# Patient Record
Sex: Female | Born: 1960 | Race: White | Hispanic: No | Marital: Married | Smoking: Never smoker
Health system: Southern US, Community
[De-identification: ages and names within clinical notes are randomized; demographics above are authoritative.]

## PROBLEM LIST (undated history)

## (undated) DIAGNOSIS — E049 Nontoxic goiter, unspecified: Secondary | ICD-10-CM

## (undated) DIAGNOSIS — U071 COVID-19: Secondary | ICD-10-CM

## (undated) DIAGNOSIS — C801 Malignant (primary) neoplasm, unspecified: Secondary | ICD-10-CM

## (undated) DIAGNOSIS — E041 Nontoxic single thyroid nodule: Secondary | ICD-10-CM

## (undated) DIAGNOSIS — M797 Fibromyalgia: Secondary | ICD-10-CM

## (undated) DIAGNOSIS — F32A Depression, unspecified: Secondary | ICD-10-CM

## (undated) DIAGNOSIS — M1990 Unspecified osteoarthritis, unspecified site: Secondary | ICD-10-CM

## (undated) DIAGNOSIS — E669 Obesity, unspecified: Secondary | ICD-10-CM

## (undated) DIAGNOSIS — Z87442 Personal history of urinary calculi: Secondary | ICD-10-CM

## (undated) DIAGNOSIS — E039 Hypothyroidism, unspecified: Secondary | ICD-10-CM

## (undated) DIAGNOSIS — K219 Gastro-esophageal reflux disease without esophagitis: Secondary | ICD-10-CM

## (undated) DIAGNOSIS — D126 Benign neoplasm of colon, unspecified: Secondary | ICD-10-CM

## (undated) DIAGNOSIS — K209 Esophagitis, unspecified without bleeding: Secondary | ICD-10-CM

## (undated) DIAGNOSIS — I509 Heart failure, unspecified: Secondary | ICD-10-CM

## (undated) DIAGNOSIS — I429 Cardiomyopathy, unspecified: Secondary | ICD-10-CM

## (undated) DIAGNOSIS — I209 Angina pectoris, unspecified: Secondary | ICD-10-CM

## (undated) DIAGNOSIS — R55 Syncope and collapse: Secondary | ICD-10-CM

## (undated) HISTORY — PX: CARDIAC CATHETERIZATION: SHX172

## (undated) HISTORY — PX: ABDOMINAL HYSTERECTOMY: SHX81

---

## 2007-04-26 ENCOUNTER — Emergency Department: Payer: Self-pay | Admitting: Emergency Medicine

## 2007-06-05 ENCOUNTER — Ambulatory Visit: Payer: Self-pay | Admitting: Internal Medicine

## 2007-07-01 ENCOUNTER — Ambulatory Visit: Payer: Self-pay | Admitting: Internal Medicine

## 2007-09-09 ENCOUNTER — Emergency Department: Payer: Self-pay | Admitting: Emergency Medicine

## 2008-05-06 ENCOUNTER — Ambulatory Visit: Payer: Self-pay | Admitting: Obstetrics and Gynecology

## 2008-05-11 ENCOUNTER — Ambulatory Visit: Payer: Self-pay | Admitting: Unknown Physician Specialty

## 2008-05-17 ENCOUNTER — Ambulatory Visit: Payer: Self-pay | Admitting: Obstetrics and Gynecology

## 2008-06-09 ENCOUNTER — Ambulatory Visit: Payer: Self-pay | Admitting: Internal Medicine

## 2008-08-12 ENCOUNTER — Ambulatory Visit: Payer: Self-pay | Admitting: Internal Medicine

## 2008-09-13 ENCOUNTER — Emergency Department: Payer: Self-pay | Admitting: Emergency Medicine

## 2009-03-05 ENCOUNTER — Emergency Department: Payer: Self-pay | Admitting: Emergency Medicine

## 2009-08-22 ENCOUNTER — Ambulatory Visit: Payer: Self-pay | Admitting: Internal Medicine

## 2009-11-05 ENCOUNTER — Emergency Department: Payer: Self-pay | Admitting: Unknown Physician Specialty

## 2010-02-16 ENCOUNTER — Emergency Department: Payer: Self-pay | Admitting: Internal Medicine

## 2010-03-23 ENCOUNTER — Ambulatory Visit: Payer: Self-pay | Admitting: Family Medicine

## 2010-06-16 ENCOUNTER — Emergency Department: Payer: Self-pay | Admitting: Emergency Medicine

## 2010-08-25 ENCOUNTER — Ambulatory Visit: Payer: Self-pay | Admitting: Family Medicine

## 2011-09-06 ENCOUNTER — Ambulatory Visit: Payer: Self-pay | Admitting: Family Medicine

## 2012-09-11 ENCOUNTER — Ambulatory Visit: Payer: Self-pay | Admitting: Cardiology

## 2012-09-18 ENCOUNTER — Ambulatory Visit: Payer: Self-pay | Admitting: Family Medicine

## 2013-09-24 ENCOUNTER — Ambulatory Visit: Payer: Self-pay | Admitting: Family Medicine

## 2014-10-01 ENCOUNTER — Ambulatory Visit
Admit: 2014-10-01 | Disposition: A | Discharge status: Home / Self Care | Payer: Self-pay | Attending: Family Medicine | Admitting: Family Medicine

## 2015-02-17 ENCOUNTER — Other Ambulatory Visit: Payer: Self-pay

## 2015-08-11 ENCOUNTER — Encounter: Payer: Self-pay | Admitting: *Deleted

## 2015-08-12 ENCOUNTER — Ambulatory Visit: Payer: BLUE CROSS/BLUE SHIELD | Admitting: Anesthesiology

## 2015-08-12 ENCOUNTER — Ambulatory Visit
Admission: RE | Admit: 2015-08-12 | Discharge: 2015-08-12 | Disposition: A | Discharge status: Home / Self Care | Payer: BLUE CROSS/BLUE SHIELD | Source: Ambulatory Visit | Attending: Unknown Physician Specialty | Admitting: Unknown Physician Specialty

## 2015-08-12 ENCOUNTER — Encounter
Admission: RE | Disposition: A | Discharge status: Home / Self Care | Payer: Self-pay | Source: Ambulatory Visit | Attending: Unknown Physician Specialty

## 2015-08-12 ENCOUNTER — Encounter: Payer: Self-pay | Admitting: Anesthesiology

## 2015-08-12 DIAGNOSIS — K640 First degree hemorrhoids: Secondary | ICD-10-CM | POA: Diagnosis not present

## 2015-08-12 DIAGNOSIS — Z1211 Encounter for screening for malignant neoplasm of colon: Secondary | ICD-10-CM | POA: Insufficient documentation

## 2015-08-12 DIAGNOSIS — E039 Hypothyroidism, unspecified: Secondary | ICD-10-CM | POA: Insufficient documentation

## 2015-08-12 DIAGNOSIS — D123 Benign neoplasm of transverse colon: Secondary | ICD-10-CM | POA: Diagnosis not present

## 2015-08-12 DIAGNOSIS — Z79899 Other long term (current) drug therapy: Secondary | ICD-10-CM | POA: Insufficient documentation

## 2015-08-12 DIAGNOSIS — D120 Benign neoplasm of cecum: Secondary | ICD-10-CM | POA: Insufficient documentation

## 2015-08-12 DIAGNOSIS — K210 Gastro-esophageal reflux disease with esophagitis: Secondary | ICD-10-CM | POA: Diagnosis not present

## 2015-08-12 HISTORY — DX: Cardiomyopathy, unspecified: I42.9

## 2015-08-12 HISTORY — DX: Heart failure, unspecified: I50.9

## 2015-08-12 HISTORY — DX: Hypothyroidism, unspecified: E03.9

## 2015-08-12 HISTORY — PX: COLONOSCOPY WITH PROPOFOL: SHX5780

## 2015-08-12 HISTORY — PX: ESOPHAGOGASTRODUODENOSCOPY (EGD) WITH PROPOFOL: SHX5813

## 2015-08-12 SURGERY — COLONOSCOPY WITH PROPOFOL
Anesthesia: General

## 2015-08-12 MED ORDER — SODIUM CHLORIDE 0.9 % IV SOLN
INTRAVENOUS | Status: DC
Start: 2015-08-12 — End: 2015-08-12

## 2015-08-12 MED ORDER — GLYCOPYRROLATE 0.2 MG/ML IJ SOLN
INTRAMUSCULAR | Status: DC | PRN
  Administered 2015-08-12: 0.2 mg via INTRAVENOUS

## 2015-08-12 MED ORDER — LIDOCAINE HCL (CARDIAC) 20 MG/ML IV SOLN
INTRAVENOUS | Status: DC | PRN
  Administered 2015-08-12: 50 mg via INTRAVENOUS

## 2015-08-12 MED ORDER — PROPOFOL 500 MG/50ML IV EMUL
INTRAVENOUS | Status: DC | PRN
  Administered 2015-08-12: 120 ug/kg/min via INTRAVENOUS

## 2015-08-12 MED ORDER — PROPOFOL 10 MG/ML IV BOLUS
INTRAVENOUS | Status: DC | PRN
  Administered 2015-08-12: 30 mg via INTRAVENOUS
  Administered 2015-08-12: 70 mg via INTRAVENOUS

## 2015-08-12 MED ORDER — MIDAZOLAM HCL 5 MG/5ML IJ SOLN
INTRAMUSCULAR | Status: DC | PRN
  Administered 2015-08-12: 1 mg via INTRAVENOUS

## 2015-08-12 MED ORDER — SODIUM CHLORIDE 0.9 % IV SOLN
INTRAVENOUS | Status: DC
  Administered 2015-08-12: 11:00:00 via INTRAVENOUS

## 2015-08-12 MED ORDER — FENTANYL CITRATE (PF) 100 MCG/2ML IJ SOLN
INTRAMUSCULAR | Status: DC | PRN
  Administered 2015-08-12: 50 ug via INTRAVENOUS

## 2015-08-12 NOTE — Anesthesia Postprocedure Evaluation (Signed)
Anesthesia Post Note  Patient: Carla Mann  Procedure(s) Performed: Procedure(s) (LRB): COLONOSCOPY WITH PROPOFOL (N/A) ESOPHAGOGASTRODUODENOSCOPY (EGD) WITH PROPOFOL (N/A)  Patient location during evaluation: Endoscopy Anesthesia Type: General Level of consciousness: awake and alert Pain management: pain level controlled Vital Signs Assessment: post-procedure vital signs reviewed and stable Respiratory status: spontaneous breathing, nonlabored ventilation, respiratory function stable and patient connected to nasal cannula oxygen Cardiovascular status: blood pressure returned to baseline and stable Postop Assessment: no signs of nausea or vomiting Anesthetic complications: no    Last Vitals:  Filed Vitals:   08/12/15 1218 08/12/15 1228  BP: 147/82 142/95  Pulse: 81 77  Temp:    Resp: 11 11    Last Pain: There were no vitals filed for this visit.               Precious Haws [place]

## 2015-08-12 NOTE — H&P (Signed)
   Primary Care Physician:  Pcp Not In System Primary Gastroenterologist:  Dr. Vira Agar EGD Pre-Procedure History & Physical: HPI:  Carla Mann is a 55 y.o. female is here for an endoscopy and colonoscopy.   Past Medical History  Diagnosis Date  . Cardiomyopathy ([place])   . CHF (congestive heart failure) ([place])   . Hypothyroidism     Past Surgical History  Procedure Laterality Date  . Cardiac catheterization      Prior to Admission medications   Medication Sig Start Date End Date Taking? Authorizing Provider  amitriptyline (ELAVIL) 50 MG tablet Take 50 mg by mouth at bedtime.   Yes Historical Provider, MD  carvedilol (COREG) 6.25 MG tablet Take 6.25 mg by mouth 2 (two) times daily with a meal.   Yes Historical Provider, MD  DULoxetine (CYMBALTA) 60 MG capsule Take 60 mg by mouth daily.   Yes Historical Provider, MD  gabapentin (NEURONTIN) 600 MG tablet Take 600 mg by mouth 3 (three) times daily.   Yes Historical Provider, MD  hydrochlorothiazide (HYDRODIURIL) 25 MG tablet Take 25 mg by mouth daily.   Yes Historical Provider, MD  levothyroxine (SYNTHROID, LEVOTHROID) 112 MCG tablet Take 112 mcg by mouth daily before breakfast.   Yes Historical Provider, MD  lisinopril (PRINIVIL,ZESTRIL) 5 MG tablet Take 5 mg by mouth daily.   Yes Historical Provider, MD  omeprazole (PRILOSEC) 20 MG capsule Take 20 mg by mouth daily.   Yes Historical Provider, MD  tizanidine (ZANAFLEX) 2 MG capsule Take 2 mg by mouth 3 (three) times daily.   Yes Historical Provider, MD    Allergies as of 07/26/2015  . (Not on File)    History reviewed. No pertinent family history.  Social History   Social History  . Marital Status: Married    Spouse Name: N/A  . Number of Children: N/A  . Years of Education: N/A   Occupational History  . Not on file.   Social History Main Topics  . Smoking status: Never Smoker   . Smokeless tobacco: Never Used  . Alcohol Use: No  . Drug Use: No  . Sexual Activity: Not  on file   Other Topics Concern  . Not on file   Social History Narrative    Review of Systems: See HPI, otherwise negative ROS  Physical Exam: BP 116/80 mmHg  Pulse 70  Temp(Src) 98.5 F (36.9 C) (Tympanic)  Resp 17  Ht 5\' 3"  (1.6 m)  Wt 114.306 kg (252 lb)  BMI 44.65 kg/m2  SpO2 99% General:   Alert,  pleasant and cooperative in NAD Head:  Normocephalic and atraumatic. Neck:  Supple; no masses or thyromegaly. Lungs:  Clear throughout to auscultation.    Heart:  Regular rate and rhythm. Abdomen:  Soft, nontender and nondistended. Normal bowel sounds, without guarding, and without rebound.   Neurologic:  Alert and  oriented x4;  grossly normal neurologically.  Impression/Plan: Carla Mann is here for an endoscopy and colonoscopy to be performed for [facility] colon polyps and chronic heartburn  Risks, benefits, limitations, and alternatives regarding  endoscopy and colonoscopy have been reviewed with the patient.  Questions have been answered.  All parties agreeable.   Gaylyn Cheers, MD  08/12/2015, 10:45 AM

## 2015-08-12 NOTE — Op Note (Signed)
[facility] Gastroenterology Patient Name: Carla Mann Procedure Date: 08/12/2015 10:18 AM MRN: [id number] Account #: [id number] Date of Birth: [birth year] Admit Type: Outpatient Age: 55 Room: [facility] ENDO ROOM 1 Gender: Female Note Status: Finalized Procedure:            Upper GI endoscopy Indications:          Heartburn Providers:            Manya Silvas, MD Referring MD:         Laurice Record. Manor, MD (Referring MD) Medicines:            Propofol per Anesthesia Complications:        No immediate complications. Procedure:            Pre-Anesthesia Assessment:                       - After reviewing the risks and benefits, the patient                        was deemed in satisfactory condition to undergo the                        procedure.                       After obtaining informed consent, the endoscope was                        passed under direct vision. Throughout the procedure,                        the patient's blood pressure, pulse, and oxygen                        saturations were monitored continuously. The Olympus                        GIF-160 endoscope (S#. [id number]) was introduced through                        the mouth, and advanced to the second part of duodenum.                        The upper GI endoscopy was accomplished without                        difficulty. The patient tolerated the procedure well. Findings:      LA Grade A (one or more mucosal breaks less than 5 mm, not extending       between tops of 2 mucosal folds) esophagitis with no bleeding was found       38 cm from the incisors. Biopsies were taken with a cold forceps for       histology.      The stomach was normal.      The examined duodenum was normal. Impression:           - LA Grade A reflux esophagitis. Rule out Barrett's                        esophagus. Biopsied.                       -  Normal stomach.                       - Normal examined  duodenum. Recommendation:       - Await pathology results.                       - Perform a colonoscopy as previously scheduled.                        Prilosec or similar medicine. Manya Silvas, MD 08/12/2015 10:57:47 AM This report has been signed electronically. Number of Addenda: 0 Note Initiated On: 08/12/2015 10:18 AM      [facility]

## 2015-08-12 NOTE — Anesthesia Preprocedure Evaluation (Signed)
Anesthesia Evaluation  Patient identified by MRN, date of birth, ID band Patient awake    Reviewed: Allergy & Precautions, H&P , NPO status , Patient's Chart, lab work & pertinent test results  Airway Mallampati: III  TM Distance: >3 FB Neck ROM: limited    Dental  (+) Poor Dentition, Chipped   Pulmonary neg pulmonary ROS, neg shortness of breath,    Pulmonary exam normal breath sounds clear to auscultation       Cardiovascular Exercise Tolerance: Good (-) angina+CHF  (-) Past MI and (-) DOE Normal cardiovascular exam Rhythm:regular Rate:Normal     Neuro/Psych negative neurological ROS  negative psych ROS   GI/Hepatic negative GI ROS, Neg liver ROS, neg GERD  ,  Endo/Other  Hypothyroidism   Renal/GU negative Renal ROS  negative genitourinary   Musculoskeletal   Abdominal   Peds  Hematology negative hematology ROS (+)   Anesthesia Other Findings Past Medical History:   Cardiomyopathy ([place])                                         CHF (congestive heart failure) ([facility])                         Hypothyroidism                                              Past Surgical History:   CARDIAC CATHETERIZATION                                      BMI    Body Mass Index   44.65 kg/m 2      Reproductive/Obstetrics negative OB ROS                             Anesthesia Physical Anesthesia Plan  ASA: III  Anesthesia Plan: General   Post-op Pain Management:    Induction:   Airway Management Planned:   Additional Equipment:   Intra-op Plan:   Post-operative Plan:   Informed Consent: I have reviewed the patients History and Physical, chart, labs and discussed the procedure including the risks, benefits and alternatives for the proposed anesthesia with the patient or authorized representative who has indicated his/her understanding and acceptance.   Dental Advisory Given  Plan Discussed  with: Anesthesiologist, CRNA and Surgeon  Anesthesia Plan Comments:         Anesthesia Quick Evaluation

## 2015-08-12 NOTE — Transfer of Care (Signed)
Immediate Anesthesia Transfer of Care Note  Patient: Carla Mann  Procedure(s) Performed: Procedure(s): COLONOSCOPY WITH PROPOFOL (N/A) ESOPHAGOGASTRODUODENOSCOPY (EGD) WITH PROPOFOL (N/A)  Patient Location: Endoscopy Unit  Anesthesia Type:General  Level of Consciousness: awake  Airway & Oxygen Therapy: Patient Spontanous Breathing and Patient connected to nasal cannula oxygen  Post-op Assessment: Report given to RN  Post vital signs: Reviewed  Last Vitals:  Filed Vitals:   08/12/15 1024 08/12/15 1157  BP: 116/80 149/95  Pulse: 70 87  Temp: 36.9 C 36.3 C  Resp: 17 14    Complications: No apparent anesthesia complications

## 2015-08-12 NOTE — Op Note (Signed)
[facility] Gastroenterology Patient Name: Carla Mann Procedure Date: 08/12/2015 10:18 AM MRN: [id number] Account #: [id number] Date of Birth: [birth year] Admit Type: Outpatient Age: 55 Room: [facility] ENDO ROOM 1 Gender: Female Note Status: Finalized Procedure:            Colonoscopy Indications:          High risk colon cancer surveillance: Personal history                        of colonic polyps Providers:            Manya Silvas, MD Medicines:            Propofol per Anesthesia Complications:        No immediate complications. Procedure:            Pre-Anesthesia Assessment:                       - After reviewing the risks and benefits, the patient                        was deemed in satisfactory condition to undergo the                        procedure.                       After obtaining informed consent, the colonoscope was                        passed under direct vision. Throughout the procedure,                        the patient's blood pressure, pulse, and oxygen                        saturations were monitored continuously. The                        Colonoscope was introduced through the anus and                        advanced to the the cecum, identified by appendiceal                        orifice and ileocecal valve. The colonoscopy was                        performed without difficulty. The patient tolerated the                        procedure well. The quality of the bowel preparation                        was good. Findings:      Two sessile polyps were found in the hepatic flexure. The polyps were       diminutive in size. These polyps were removed with a jumbo cold forceps.       Resection and retrieval were complete.      Two sessile polyps were found in the hepatic flexure and cecum. The  polyps were small in size. These polyps were removed with a cold snare.       Resection and retrieval were complete.      Internal  hemorrhoids were found during endoscopy. The hemorrhoids were       small and Grade I (internal hemorrhoids that do not prolapse).      The exam was otherwise without abnormality. Impression:           - Two diminutive polyps at the hepatic flexure, removed                        with a jumbo cold forceps. Resected and retrieved.                       - Two small polyps at the hepatic flexure and in the                        cecum, removed with a cold snare. Resected and                        retrieved.                       - Internal hemorrhoids.                       - The examination was otherwise normal. Recommendation:       - Await pathology results. Manya Silvas, MD 08/12/2015 11:58:14 AM This report has been signed electronically. Number of Addenda: 0 Note Initiated On: 08/12/2015 10:18 AM Scope Withdrawal Time: 0 hours 16 minutes 43 seconds  Total Procedure Duration: 0 hours 28 minutes 28 seconds       [facility]

## 2015-08-15 LAB — SURGICAL PATHOLOGY

## 2015-08-17 ENCOUNTER — Encounter: Payer: Self-pay | Admitting: Unknown Physician Specialty

## 2015-08-18 ENCOUNTER — Emergency Department: Payer: BLUE CROSS/BLUE SHIELD

## 2015-08-18 ENCOUNTER — Emergency Department
Admission: EM | Admit: 2015-08-18 | Discharge: 2015-08-18 | Disposition: A | Discharge status: Home / Self Care | Payer: BLUE CROSS/BLUE SHIELD | Attending: Emergency Medicine | Admitting: Emergency Medicine

## 2015-08-18 ENCOUNTER — Encounter: Payer: Self-pay | Admitting: Emergency Medicine

## 2015-08-18 DIAGNOSIS — Y998 Other external cause status: Secondary | ICD-10-CM | POA: Diagnosis not present

## 2015-08-18 DIAGNOSIS — Y9289 Other specified places as the place of occurrence of the external cause: Secondary | ICD-10-CM | POA: Diagnosis not present

## 2015-08-18 DIAGNOSIS — S199XXA Unspecified injury of neck, initial encounter: Secondary | ICD-10-CM | POA: Diagnosis present

## 2015-08-18 DIAGNOSIS — S299XXA Unspecified injury of thorax, initial encounter: Secondary | ICD-10-CM | POA: Insufficient documentation

## 2015-08-18 DIAGNOSIS — S4991XA Unspecified injury of right shoulder and upper arm, initial encounter: Secondary | ICD-10-CM | POA: Insufficient documentation

## 2015-08-18 DIAGNOSIS — W19XXXA Unspecified fall, initial encounter: Secondary | ICD-10-CM

## 2015-08-18 DIAGNOSIS — S0990XA Unspecified injury of head, initial encounter: Secondary | ICD-10-CM | POA: Diagnosis not present

## 2015-08-18 DIAGNOSIS — Z880 Allergy status to penicillin: Secondary | ICD-10-CM | POA: Insufficient documentation

## 2015-08-18 DIAGNOSIS — S8991XA Unspecified injury of right lower leg, initial encounter: Secondary | ICD-10-CM | POA: Insufficient documentation

## 2015-08-18 DIAGNOSIS — S3992XA Unspecified injury of lower back, initial encounter: Secondary | ICD-10-CM | POA: Diagnosis not present

## 2015-08-18 DIAGNOSIS — Y9301 Activity, walking, marching and hiking: Secondary | ICD-10-CM | POA: Insufficient documentation

## 2015-08-18 DIAGNOSIS — S6991XA Unspecified injury of right wrist, hand and finger(s), initial encounter: Secondary | ICD-10-CM | POA: Diagnosis not present

## 2015-08-18 DIAGNOSIS — T148 Other injury of unspecified body region: Secondary | ICD-10-CM | POA: Insufficient documentation

## 2015-08-18 DIAGNOSIS — W108XXA Fall (on) (from) other stairs and steps, initial encounter: Secondary | ICD-10-CM | POA: Diagnosis not present

## 2015-08-18 DIAGNOSIS — T07XXXA Unspecified multiple injuries, initial encounter: Secondary | ICD-10-CM

## 2015-08-18 DIAGNOSIS — Z79899 Other long term (current) drug therapy: Secondary | ICD-10-CM | POA: Insufficient documentation

## 2015-08-18 NOTE — ED Provider Notes (Signed)
[facility] Emergency Department Provider Note  Time seen: 3:44 PM  I have reviewed the triage vital signs and the nursing notes.   HISTORY  Chief Complaint Fall    HPI Carla Mann is a 55 y.o. female with a past medical history CHF, presents to the emergency department after a fall. According to the patient she was walking backwards down the steps when she fell approximately 3 steps backwards hitting her head. Denies LOC, nausea or vomiting. Patient states pain to her head and neck back and right arm, did not attempt to ambulate. Patient laid on the floor until her husband came home approximately 30 minutes later and called EMS who brought her to the emergency department here the patient states she feels contusions, bodyaches, does not wish for anything for pain at this time. States mild headache, moderate neck pain, moderate right shoulder pain, mild right knee pain and mild diffuse back pain.Denies any focal weakness or numbness.     Past Medical History  Diagnosis Date  . Cardiomyopathy ([place])   . CHF (congestive heart failure) ([place])   . Hypothyroidism     There are no active problems to display for this patient.   Past Surgical History  Procedure Laterality Date  . Cardiac catheterization    . Colonoscopy with propofol N/A 08/12/2015    Procedure: COLONOSCOPY WITH PROPOFOL;  Surgeon: Manya Silvas, MD;  Location: [facility] ENDOSCOPY;  Service: Endoscopy;  Laterality: N/A;  . Esophagogastroduodenoscopy (egd) with propofol N/A 08/12/2015    Procedure: ESOPHAGOGASTRODUODENOSCOPY (EGD) WITH PROPOFOL;  Surgeon: Manya Silvas, MD;  Location: [facility] ENDOSCOPY;  Service: Endoscopy;  Laterality: N/A;    Current Outpatient Rx  Name  Route  Sig  Dispense  Refill  . amitriptyline (ELAVIL) 50 MG tablet   Oral   Take 50 mg by mouth at bedtime.         . carvedilol (COREG) 6.25 MG tablet   Oral   Take 6.25 mg by mouth 2 (two) times daily with a meal.          . DULoxetine (CYMBALTA) 60 MG capsule   Oral   Take 60 mg by mouth daily.         Marland Kitchen gabapentin (NEURONTIN) 600 MG tablet   Oral   Take 600 mg by mouth 3 (three) times daily.         . hydrochlorothiazide (HYDRODIURIL) 25 MG tablet   Oral   Take 25 mg by mouth daily.         Marland Kitchen levothyroxine (SYNTHROID, LEVOTHROID) 112 MCG tablet   Oral   Take 112 mcg by mouth daily before breakfast.         . lisinopril (PRINIVIL,ZESTRIL) 5 MG tablet   Oral   Take 5 mg by mouth daily.         Marland Kitchen omeprazole (PRILOSEC) 20 MG capsule   Oral   Take 20 mg by mouth daily.         . tizanidine (ZANAFLEX) 2 MG capsule   Oral   Take 2 mg by mouth 3 (three) times daily.           Allergies Penicillins  History reviewed. No pertinent family history.  Social History Social History  Substance Use Topics  . Smoking status: Never Smoker   . Smokeless tobacco: Never Used  . Alcohol Use: No    Review of Systems Constitutional: Negative for fever. Cardiovascular: Negative for chest pain. Respiratory: Negative for shortness of  breath. Gastrointestinal: Negative for abdominal pain Musculoskeletal: Mild diffuse back pain. Positive right knee pain. Positive right shoulder pain. Mild right hand pain. Positive neck pain. Positive occipital scalp pain. Skin: Negative for rash. No abrasions or lacerations. Neurological: Mild headache denies focal weakness or numbness 10-point ROS otherwise negative.  ____________________________________________   PHYSICAL EXAM:  VITAL SIGNS: ED Triage Vitals  Enc Vitals Group     BP 08/18/15 0941 124/85 mmHg     Pulse Rate 08/18/15 0941 68     Resp 08/18/15 0941 18     Temp 08/18/15 0941 97.9 F (36.6 C)     Temp Source 08/18/15 0941 Oral     SpO2 08/18/15 0941 97 %     Weight 08/18/15 0941 250 lb (113.399 kg)     Height 08/18/15 0941 5\' 3"  (1.6 m)     Head Cir --      Peak Flow --      Pain Score 08/18/15 0941 7     Pain Loc --       Pain Edu? --      Excl. in [place]? --     Constitutional: Alert and oriented. Well appearing and in no distress. Eyes: Normal exam ENT   Head: Normocephalic and atraumatic. Mild midline C-spine tenderness palpation, c-collar in place.   Mouth/Throat: Mucous membranes are moist. Cardiovascular: Normal rate, regular rhythm.  Respiratory: Normal respiratory effort without tachypnea nor retractions. Breath sounds are clear. Mild diffuse chest tenderness to palpation, no focal tenderness identified. Gastrointestinal: Soft and nontender. No distention.  Musculoskeletal: Moderate right shoulder pain, mild right hand pain, mild right knee pain. Mild cervical spine tenderness palpation, no thoracic tenderness, mild lumbar tenderness to palpation. Pelvis is stable. Good range of motion in hips without pain. Neurologic:  Normal speech and language. No gross focal neurologic deficits Skin:  Skin is warm, dry and intact.  Psychiatric: Mood and affect are normal. Speech and behavior are normal. ____________________________________________   RADIOLOGY  CT showed no acute abnormality X-rays are negative for acute fracture.  ____________________________________________    INITIAL IMPRESSION / ASSESSMENT AND PLAN / ED COURSE  Pertinent labs & imaging results that were available during my care of the patient were reviewed by me and considered in my medical decision making (see chart for details).  Patient presents after mechanical fall backwards. Denies LOC, nausea, vomiting, focal weakness or numbness. CTs are negative. X-ray showed no acute fracture. Patient does not wish for pain medication states she will take Advil at home. Patient able to ambulate without difficulty after workup was completed. We will discharge patient home with primary care follow-up.  ____________________________________________   FINAL CLINICAL IMPRESSION(S) / ED DIAGNOSES  Fall Multiple contusions  Harvest Dark, MD 08/18/15 1547

## 2015-08-18 NOTE — ED Notes (Signed)
Pt up to br with assistance.  Tolerated walking well

## 2015-08-18 NOTE — ED Notes (Signed)
Pt's head elevated in bed

## 2015-08-18 NOTE — Discharge Instructions (Signed)

## 2015-08-18 NOTE — ED Notes (Signed)
Resumed care from Ramonica Grigg t. Rn.  c-collar still in place.  Pt alert.  famiy with pt.

## 2015-08-18 NOTE — ED Notes (Signed)
Per [facility] pt was walking downstairss backwards when she fell. Pt states she hit her head, denies LOC. EMS states unable to put on a c-collar because it would not fit. C-Collar applied by this RN on arrival. EMS states pain to R shoulder, hip, knee and neck at this time. EMS states no rotation or shortening noted to hip or knee. Pty BP 142/71, NSR 77. EMS states that she has not had her morning meds at this time.

## 2015-08-18 NOTE — ED Notes (Signed)
Pt still in x-ray

## 2015-08-18 NOTE — ED Notes (Addendum)
c collar removed by md

## 2017-05-03 ENCOUNTER — Other Ambulatory Visit: Payer: Self-pay | Admitting: Family Medicine

## 2017-05-03 DIAGNOSIS — Z1231 Encounter for screening mammogram for malignant neoplasm of breast: Secondary | ICD-10-CM

## 2017-05-24 ENCOUNTER — Ambulatory Visit
Admission: RE | Admit: 2017-05-24 | Discharge: 2017-05-24 | Disposition: A | Discharge status: Home / Self Care | Payer: BLUE CROSS/BLUE SHIELD | Source: Ambulatory Visit | Attending: Family Medicine | Admitting: Family Medicine

## 2017-05-24 DIAGNOSIS — Z1231 Encounter for screening mammogram for malignant neoplasm of breast: Secondary | ICD-10-CM | POA: Diagnosis not present

## 2017-05-24 HISTORY — DX: Malignant (primary) neoplasm, unspecified: C80.1

## 2018-04-21 ENCOUNTER — Other Ambulatory Visit: Payer: Self-pay | Admitting: Family Medicine

## 2018-04-21 DIAGNOSIS — Z1231 Encounter for screening mammogram for malignant neoplasm of breast: Secondary | ICD-10-CM

## 2018-05-26 ENCOUNTER — Encounter (INDEPENDENT_AMBULATORY_CARE_PROVIDER_SITE_OTHER): Payer: Self-pay

## 2018-05-26 ENCOUNTER — Ambulatory Visit
Admission: RE | Admit: 2018-05-26 | Discharge: 2018-05-26 | Disposition: A | Discharge status: Home / Self Care | Payer: BLUE CROSS/BLUE SHIELD | Source: Ambulatory Visit | Attending: Family Medicine | Admitting: Family Medicine

## 2018-05-26 DIAGNOSIS — Z1231 Encounter for screening mammogram for malignant neoplasm of breast: Secondary | ICD-10-CM | POA: Diagnosis not present

## 2018-10-06 ENCOUNTER — Ambulatory Visit
Admission: RE | Admit: 2018-10-06 | Payer: BLUE CROSS/BLUE SHIELD | Source: Home / Self Care | Admitting: Unknown Physician Specialty

## 2018-10-06 ENCOUNTER — Encounter: Admission: RE | Payer: Self-pay | Source: Home / Self Care

## 2018-10-06 SURGERY — COLONOSCOPY WITH PROPOFOL
Anesthesia: General

## 2019-04-23 ENCOUNTER — Other Ambulatory Visit
Admission: RE | Admit: 2019-04-23 | Discharge: 2019-04-23 | Disposition: A | Discharge status: Home / Self Care | Payer: Medicare Other | Source: Ambulatory Visit | Attending: Internal Medicine | Admitting: Internal Medicine

## 2019-04-23 ENCOUNTER — Other Ambulatory Visit: Payer: Self-pay

## 2019-04-27 ENCOUNTER — Other Ambulatory Visit: Payer: Self-pay | Admitting: Family Medicine

## 2019-04-27 DIAGNOSIS — Z1231 Encounter for screening mammogram for malignant neoplasm of breast: Secondary | ICD-10-CM

## 2019-06-01 ENCOUNTER — Ambulatory Visit
Admission: RE | Admit: 2019-06-01 | Discharge: 2019-06-01 | Disposition: A | Discharge status: Home / Self Care | Payer: BC Managed Care – PPO | Source: Ambulatory Visit | Attending: Family Medicine | Admitting: Family Medicine

## 2019-06-01 DIAGNOSIS — Z1231 Encounter for screening mammogram for malignant neoplasm of breast: Secondary | ICD-10-CM | POA: Diagnosis not present

## 2019-06-10 ENCOUNTER — Ambulatory Visit: Payer: Medicare Other | Attending: Internal Medicine

## 2019-06-10 DIAGNOSIS — Z20822 Contact with and (suspected) exposure to covid-19: Secondary | ICD-10-CM

## 2019-06-12 LAB — NOVEL CORONAVIRUS, NAA: SARS-CoV-2, NAA: NOT DETECTED

## 2019-06-22 ENCOUNTER — Ambulatory Visit: Payer: Medicare Other | Attending: Internal Medicine

## 2019-06-22 DIAGNOSIS — Z20822 Contact with and (suspected) exposure to covid-19: Secondary | ICD-10-CM

## 2019-06-23 ENCOUNTER — Telehealth: Payer: Self-pay | Admitting: *Deleted

## 2019-06-23 ENCOUNTER — Emergency Department: Payer: BC Managed Care – PPO

## 2019-06-23 ENCOUNTER — Encounter: Payer: Self-pay | Admitting: Emergency Medicine

## 2019-06-23 ENCOUNTER — Other Ambulatory Visit: Payer: Self-pay

## 2019-06-23 ENCOUNTER — Emergency Department
Admission: EM | Admit: 2019-06-23 | Discharge: 2019-06-23 | Disposition: A | Discharge status: Home / Self Care | Payer: BC Managed Care – PPO | Attending: Emergency Medicine | Admitting: Emergency Medicine

## 2019-06-23 DIAGNOSIS — I509 Heart failure, unspecified: Secondary | ICD-10-CM | POA: Insufficient documentation

## 2019-06-23 DIAGNOSIS — Z79899 Other long term (current) drug therapy: Secondary | ICD-10-CM | POA: Diagnosis not present

## 2019-06-23 DIAGNOSIS — Z8541 Personal history of malignant neoplasm of cervix uteri: Secondary | ICD-10-CM | POA: Insufficient documentation

## 2019-06-23 DIAGNOSIS — R0789 Other chest pain: Secondary | ICD-10-CM | POA: Diagnosis present

## 2019-06-23 DIAGNOSIS — E039 Hypothyroidism, unspecified: Secondary | ICD-10-CM | POA: Diagnosis not present

## 2019-06-23 DIAGNOSIS — J1289 Other viral pneumonia: Secondary | ICD-10-CM | POA: Diagnosis not present

## 2019-06-23 DIAGNOSIS — U071 COVID-19: Secondary | ICD-10-CM | POA: Insufficient documentation

## 2019-06-23 LAB — CBC
HCT: 34.9 % — ABNORMAL LOW (ref 36.0–46.0)
Hemoglobin: 11.5 g/dL — ABNORMAL LOW (ref 12.0–15.0)
MCH: 27.3 pg (ref 26.0–34.0)
MCHC: 33 g/dL (ref 30.0–36.0)
MCV: 82.7 fL (ref 80.0–100.0)
Platelets: 322 10*3/uL (ref 150–400)
RBC: 4.22 MIL/uL (ref 3.87–5.11)
RDW: 13.8 % (ref 11.5–15.5)
WBC: 8.8 10*3/uL (ref 4.0–10.5)
nRBC: 0 % (ref 0.0–0.2)

## 2019-06-23 LAB — BASIC METABOLIC PANEL
Anion gap: 14 (ref 5–15)
BUN: 16 mg/dL (ref 6–20)
CO2: 26 mmol/L (ref 22–32)
Calcium: 8.7 mg/dL — ABNORMAL LOW (ref 8.9–10.3)
Chloride: 95 mmol/L — ABNORMAL LOW (ref 98–111)
Creatinine, Ser: 1.19 mg/dL — ABNORMAL HIGH (ref 0.44–1.00)
GFR calc Af Amer: 58 mL/min — ABNORMAL LOW (ref >60)
GFR calc non Af Amer: 50 mL/min — ABNORMAL LOW (ref >60)
Glucose, Bld: 117 mg/dL — ABNORMAL HIGH (ref 70–99)
Potassium: 2.9 mmol/L — ABNORMAL LOW (ref 3.5–5.1)
Sodium: 135 mmol/L (ref 135–145)

## 2019-06-23 LAB — TROPONIN I (HIGH SENSITIVITY): Troponin I (High Sensitivity): 5 ng/L (ref <18)

## 2019-06-23 LAB — NOVEL CORONAVIRUS, NAA: SARS-CoV-2, NAA: DETECTED — AB

## 2019-06-23 NOTE — Telephone Encounter (Signed)
Reviewed positive Covid 19 results with patient. Moderate to severe SOB noted while talking with her. She has been using a rescue inhaler with no improvement. Advised ED at this time, with mask on.  TC to [facility] with report on patient's arrival by private vehicle.

## 2019-06-23 NOTE — ED Provider Notes (Signed)
[facility] Emergency Department Provider Note   ____________________________________________    I have reviewed the triage vital signs and the nursing notes.   HISTORY  Chief Complaint Chest Pain and Shortness of Breath     HPI Carla Mann is a 59 y.o. female with a history of CHF who presents with complaints of chest discomfort and shortness of breath.  Patient recently diagnosed with novel coronavirus.  Patient reports shortness of breath is gradually worsened over the last day and she was uncomfortable last night and had difficulty sleeping.  She describes achiness in her chest for multiple days.  Reports her husband has similar symptoms.  No nausea or vomiting or abdominal pain.  Has not take anything for this besides ibuprofen  Past Medical History:  Diagnosis Date  . Cancer ([place])    cervical  . Cardiomyopathy ([place])   . CHF (congestive heart failure) ([place])   . Hypothyroidism     There are no problems to display for this patient.   Past Surgical History:  Procedure Laterality Date  . ABDOMINAL HYSTERECTOMY    . CARDIAC CATHETERIZATION    . COLONOSCOPY WITH PROPOFOL N/A 08/12/2015   Procedure: COLONOSCOPY WITH PROPOFOL;  Surgeon: Manya Silvas, MD;  Location: [facility] ENDOSCOPY;  Service: Endoscopy;  Laterality: N/A;  . ESOPHAGOGASTRODUODENOSCOPY (EGD) WITH PROPOFOL N/A 08/12/2015   Procedure: ESOPHAGOGASTRODUODENOSCOPY (EGD) WITH PROPOFOL;  Surgeon: Manya Silvas, MD;  Location: [facility] ENDOSCOPY;  Service: Endoscopy;  Laterality: N/A;    Prior to Admission medications   Medication Sig Start Date End Date Taking? Authorizing Provider  amitriptyline (ELAVIL) 50 MG tablet Take 50 mg by mouth at bedtime.    [provider]  carvedilol (COREG) 6.25 MG tablet Take 6.25 mg by mouth 2 (two) times daily with a meal.    [provider]  DULoxetine (CYMBALTA) 60 MG capsule Take 60 mg by mouth daily.    [provider]    gabapentin (NEURONTIN) 600 MG tablet Take 600 mg by mouth 3 (three) times daily.    [provider]  hydrochlorothiazide (HYDRODIURIL) 25 MG tablet Take 25 mg by mouth daily.    [provider]  levothyroxine (SYNTHROID, LEVOTHROID) 112 MCG tablet Take 112 mcg by mouth daily before breakfast.    [provider]  lisinopril (PRINIVIL,ZESTRIL) 5 MG tablet Take 5 mg by mouth daily.    [provider]  omeprazole (PRILOSEC) 20 MG capsule Take 20 mg by mouth daily.    [provider]  tizanidine (ZANAFLEX) 2 MG capsule Take 2 mg by mouth 3 (three) times daily.    [provider]     Allergies Penicillins  Family History  Problem Relation Age of Onset  . Breast cancer Neg Hx     Social History Social History   Tobacco Use  . Smoking status: Never Smoker  . Smokeless tobacco: Never Used  Substance Use Topics  . Alcohol use: No  . Drug use: No    Review of Systems  Constitutional: Fevers Eyes: No visual changes.  ENT: No sore throat. Cardiovascular: As above Respiratory: As above Gastrointestinal: No abdominal pain.  No nausea, no vomiting.   Genitourinary: Negative for dysuria. Musculoskeletal: Body aches Skin: Negative for rash. Neurological: Negative for headaches or weakness   ____________________________________________   PHYSICAL EXAM:  VITAL SIGNS: ED Triage Vitals  Enc Vitals Group     BP 06/23/19 0906 (!) 109/52     Pulse Rate 06/23/19 0906 61  Resp 06/23/19 0906 (!) 24     Temp 06/23/19 0906 97.8 F (36.6 C)     Temp Source 06/23/19 0906 Oral     SpO2 06/23/19 0906 99 %     Weight 06/23/19 0844 113.4 kg (250 lb)     Height 06/23/19 0844 1.6 m (5\' 3" )     Head Circumference --      Peak Flow --      Pain Score 06/23/19 0844 9     Pain Loc --      Pain Edu? --      Excl. in [place]? --     Constitutional: Alert and oriented.    Nose: No congestion/rhinnorhea. Mouth/Throat: Mucous membranes are  moist.    Cardiovascular: Normal rate, regular rhythm.   Good peripheral circulation. Respiratory: Normal respiratory effort.  No retractions.  Genitourinary: deferred Musculoskeletal: No lower extremity tenderness nor edema.  Warm and well perfused Neurologic:  Normal speech and language. No gross focal neurologic deficits are appreciated.  Skin:  Skin is warm, dry and intact. No rash noted. Psychiatric: Mood and affect are normal. Speech and behavior are normal.  ____________________________________________   LABS (all labs ordered are listed, but only abnormal results are displayed)  Labs Reviewed  BASIC METABOLIC PANEL - Abnormal; Notable for the following components:      Result Value   Potassium 2.9 (*)    Chloride 95 (*)    Glucose, Bld 117 (*)    Creatinine, Ser 1.19 (*)    Calcium 8.7 (*)    GFR calc non Af Amer 50 (*)    GFR calc Af Amer 58 (*)    All other components within normal limits  CBC - Abnormal; Notable for the following components:   Hemoglobin 11.5 (*)    HCT 34.9 (*)    All other components within normal limits  TROPONIN I (HIGH SENSITIVITY)  TROPONIN I (HIGH SENSITIVITY)   ____________________________________________  EKG  ED ECG REPORT I, Lavonia Drafts, the attending physician, personally viewed and interpreted this ECG.  Date: 06/23/2019  Rhythm: normal sinus rhythm QRS Axis: normal Intervals: normal ST/T Wave abnormalities: normal Narrative Interpretation: no evidence of acute ischemia  ____________________________________________  RADIOLOGY  Chest x-ray consistent with multifocal pneumonia ____________________________________________   PROCEDURES  Procedure(s) performed: No  Procedures   Critical Care performed: No ____________________________________________   INITIAL IMPRESSION / ASSESSMENT AND PLAN / ED COURSE  Pertinent labs & imaging results that were available during my care of the patient were reviewed by me and  considered in my medical decision making (see chart for details).  Patient presents with known COVID-19 recently diagnosed now with mildly worsening shortness of breath with chest discomfort.  Chest x-ray is consistent with Covid pneumonia.  Lab work is overall reassuring, troponin is normal, EKG is unremarkable.  Not consistent with ACS.  Chest discomfort likely caused by Covid pneumonia.  No occasion for steroids at this point.  Recommend Tylenol Motrin, outpatient follow-up as needed, return precautions discussed at length    ____________________________________________   FINAL CLINICAL IMPRESSION(S) / ED DIAGNOSES  Final diagnoses:  Pneumonia due to COVID-19 virus        Note:  This document was prepared using Dragon voice recognition software and may include unintentional dictation errors.   Lavonia Drafts, MD 06/23/19 1024

## 2019-06-23 NOTE — ED Triage Notes (Signed)
Pt reports tested positive for COVID yesterday and today  with increased SOB and chest tightness. Pt called [facility] triage line and was told to come here.

## 2019-08-12 ENCOUNTER — Ambulatory Visit: Admission: RE | Admit: 2019-08-12 | Payer: Medicare Other | Source: Home / Self Care | Admitting: Internal Medicine

## 2019-08-12 ENCOUNTER — Encounter: Admission: RE | Payer: Self-pay | Source: Home / Self Care

## 2019-08-12 ENCOUNTER — Ambulatory Visit: Admit: 2019-08-12 | Payer: Medicare Other | Admitting: Internal Medicine

## 2019-08-12 SURGERY — COLONOSCOPY WITH PROPOFOL
Anesthesia: General

## 2019-10-30 ENCOUNTER — Other Ambulatory Visit: Payer: Self-pay | Admitting: Family Medicine

## 2019-10-30 DIAGNOSIS — N644 Mastodynia: Secondary | ICD-10-CM

## 2019-11-02 ENCOUNTER — Other Ambulatory Visit: Payer: Self-pay | Admitting: Family Medicine

## 2019-11-02 DIAGNOSIS — N644 Mastodynia: Secondary | ICD-10-CM

## 2019-11-18 ENCOUNTER — Ambulatory Visit
Admission: RE | Admit: 2019-11-18 | Discharge: 2019-11-18 | Disposition: A | Discharge status: Home / Self Care | Payer: BC Managed Care – PPO | Source: Ambulatory Visit | Attending: Family Medicine | Admitting: Family Medicine

## 2019-11-18 DIAGNOSIS — N644 Mastodynia: Secondary | ICD-10-CM

## 2019-12-01 ENCOUNTER — Other Ambulatory Visit
Admission: RE | Admit: 2019-12-01 | Discharge: 2019-12-01 | Disposition: A | Discharge status: Home / Self Care | Payer: BC Managed Care – PPO | Source: Ambulatory Visit | Attending: Internal Medicine | Admitting: Internal Medicine

## 2019-12-01 ENCOUNTER — Other Ambulatory Visit: Payer: Self-pay

## 2019-12-01 DIAGNOSIS — Z01812 Encounter for preprocedural laboratory examination: Secondary | ICD-10-CM | POA: Insufficient documentation

## 2019-12-01 DIAGNOSIS — Z20822 Contact with and (suspected) exposure to covid-19: Secondary | ICD-10-CM | POA: Diagnosis not present

## 2019-12-02 ENCOUNTER — Encounter: Payer: Self-pay | Admitting: Internal Medicine

## 2019-12-02 LAB — SARS CORONAVIRUS 2 (TAT 6-24 HRS): SARS Coronavirus 2: NEGATIVE

## 2019-12-03 ENCOUNTER — Encounter
Admission: RE | Disposition: A | Discharge status: Home / Self Care | Payer: Self-pay | Source: Home / Self Care | Attending: Internal Medicine

## 2019-12-03 ENCOUNTER — Encounter: Payer: Self-pay | Admitting: Internal Medicine

## 2019-12-03 ENCOUNTER — Ambulatory Visit
Admission: RE | Admit: 2019-12-03 | Discharge: 2019-12-03 | Disposition: A | Discharge status: Home / Self Care | Payer: BC Managed Care – PPO | Attending: Internal Medicine | Admitting: Internal Medicine

## 2019-12-03 ENCOUNTER — Ambulatory Visit: Payer: BC Managed Care – PPO | Admitting: Anesthesiology

## 2019-12-03 DIAGNOSIS — E039 Hypothyroidism, unspecified: Secondary | ICD-10-CM | POA: Insufficient documentation

## 2019-12-03 DIAGNOSIS — Z791 Long term (current) use of non-steroidal anti-inflammatories (NSAID): Secondary | ICD-10-CM | POA: Insufficient documentation

## 2019-12-03 DIAGNOSIS — F329 Major depressive disorder, single episode, unspecified: Secondary | ICD-10-CM | POA: Insufficient documentation

## 2019-12-03 DIAGNOSIS — Z6841 Body Mass Index (BMI) 40.0 and over, adult: Secondary | ICD-10-CM | POA: Diagnosis not present

## 2019-12-03 DIAGNOSIS — Z1211 Encounter for screening for malignant neoplasm of colon: Secondary | ICD-10-CM | POA: Insufficient documentation

## 2019-12-03 DIAGNOSIS — Z79899 Other long term (current) drug therapy: Secondary | ICD-10-CM | POA: Insufficient documentation

## 2019-12-03 DIAGNOSIS — Z79890 Hormone replacement therapy (postmenopausal): Secondary | ICD-10-CM | POA: Diagnosis not present

## 2019-12-03 DIAGNOSIS — Z8541 Personal history of malignant neoplasm of cervix uteri: Secondary | ICD-10-CM | POA: Diagnosis not present

## 2019-12-03 DIAGNOSIS — M797 Fibromyalgia: Secondary | ICD-10-CM | POA: Diagnosis not present

## 2019-12-03 DIAGNOSIS — E669 Obesity, unspecified: Secondary | ICD-10-CM | POA: Insufficient documentation

## 2019-12-03 DIAGNOSIS — I509 Heart failure, unspecified: Secondary | ICD-10-CM | POA: Diagnosis not present

## 2019-12-03 DIAGNOSIS — Z86010 Personal history of colonic polyps: Secondary | ICD-10-CM | POA: Diagnosis not present

## 2019-12-03 DIAGNOSIS — D120 Benign neoplasm of cecum: Secondary | ICD-10-CM | POA: Diagnosis not present

## 2019-12-03 DIAGNOSIS — D1779 Benign lipomatous neoplasm of other sites: Secondary | ICD-10-CM | POA: Insufficient documentation

## 2019-12-03 DIAGNOSIS — Z8616 Personal history of COVID-19: Secondary | ICD-10-CM | POA: Insufficient documentation

## 2019-12-03 DIAGNOSIS — I429 Cardiomyopathy, unspecified: Secondary | ICD-10-CM | POA: Insufficient documentation

## 2019-12-03 DIAGNOSIS — K219 Gastro-esophageal reflux disease without esophagitis: Secondary | ICD-10-CM | POA: Diagnosis not present

## 2019-12-03 DIAGNOSIS — M1990 Unspecified osteoarthritis, unspecified site: Secondary | ICD-10-CM | POA: Diagnosis not present

## 2019-12-03 DIAGNOSIS — K640 First degree hemorrhoids: Secondary | ICD-10-CM | POA: Insufficient documentation

## 2019-12-03 DIAGNOSIS — Z880 Allergy status to penicillin: Secondary | ICD-10-CM | POA: Insufficient documentation

## 2019-12-03 HISTORY — DX: Nontoxic single thyroid nodule: E04.1

## 2019-12-03 HISTORY — DX: Nontoxic goiter, unspecified: E04.9

## 2019-12-03 HISTORY — DX: Gastro-esophageal reflux disease without esophagitis: K21.9

## 2019-12-03 HISTORY — DX: Obesity, unspecified: E66.9

## 2019-12-03 HISTORY — DX: COVID-19: U07.1

## 2019-12-03 HISTORY — DX: Fibromyalgia: M79.7

## 2019-12-03 HISTORY — DX: Personal history of urinary calculi: Z87.442

## 2019-12-03 HISTORY — DX: Syncope and collapse: R55

## 2019-12-03 HISTORY — DX: Depression, unspecified: F32.A

## 2019-12-03 HISTORY — DX: Benign neoplasm of colon, unspecified: D12.6

## 2019-12-03 HISTORY — DX: Esophagitis, unspecified without bleeding: K20.90

## 2019-12-03 HISTORY — DX: Unspecified osteoarthritis, unspecified site: M19.90

## 2019-12-03 HISTORY — DX: Angina pectoris, unspecified: I20.9

## 2019-12-03 HISTORY — PX: COLONOSCOPY WITH PROPOFOL: SHX5780

## 2019-12-03 SURGERY — COLONOSCOPY WITH PROPOFOL
Anesthesia: General

## 2019-12-03 MED ORDER — SODIUM CHLORIDE 0.9 % IV SOLN: INTRAVENOUS | Status: DC

## 2019-12-03 MED ORDER — PROPOFOL 500 MG/50ML IV EMUL
INTRAVENOUS | Status: DC | PRN
  Administered 2019-12-03: 200 ug/kg/min via INTRAVENOUS

## 2019-12-03 MED ORDER — PROPOFOL 10 MG/ML IV BOLUS
INTRAVENOUS | Status: DC | PRN
  Administered 2019-12-03: 60 mg via INTRAVENOUS

## 2019-12-03 NOTE — Anesthesia Procedure Notes (Signed)
Date/Time: 12/03/2019 9:45 AM Performed by: Nelda Marseille, CRNA Pre-anesthesia Checklist: Patient identified, Emergency Drugs available, Suction available, Patient being monitored and Timeout performed Oxygen Delivery Method: Nasal cannula

## 2019-12-03 NOTE — Interval H&P Note (Signed)
History and Physical Interval Note:  12/03/2019 9:15 AM  Carla Mann  has presented today for surgery, with the diagnosis of PERSONAL HX.OF COLON POLYPS.  The various methods of treatment have been discussed with the patient and family. After consideration of risks, benefits and other options for treatment, the patient has consented to  Procedure(s): COLONOSCOPY WITH PROPOFOL (N/A) as a surgical intervention.  The patient's history has been reviewed, patient examined, no change in status, stable for surgery.  I have reviewed the patient's chart and labs.  Questions were answered to the patient's satisfaction.     [place]

## 2019-12-03 NOTE — Op Note (Signed)
[facility] Gastroenterology Patient Name: Carla Mann Procedure Date: 12/03/2019 9:20 AM MRN: [id number] Account #: [id number] Date of Birth: [birth year] Admit Type: Outpatient Age: 59 Room: [facility] ENDO ROOM 3 Gender: Female Note Status: Finalized Procedure:             Colonoscopy Indications:           Surveillance: Personal history of adenomatous polyps                         on last colonoscopy > 5 years ago Providers:             Lorie Apley K. Alice Reichert MD, MD Referring MD:          Laurice Record. [place], MD (Referring MD) Medicines:             Propofol per Anesthesia Complications:         No immediate complications. Procedure:             Pre-Anesthesia Assessment:                        - The risks and benefits of the procedure and the                         sedation options and risks were discussed with the                         patient. All questions were answered and informed                         consent was obtained.                        - Patient identification and proposed procedure were                         verified prior to the procedure by the nurse. The                         procedure was verified in the procedure room.                        - ASA Grade Assessment: III - A patient with severe                         systemic disease.                        - After reviewing the risks and benefits, the patient                         was deemed in satisfactory condition to undergo the                         procedure.                        After obtaining informed consent, the colonoscope was                         passed under direct  vision. Throughout the procedure,                         the patient's blood pressure, pulse, and oxygen                         saturations were monitored continuously. The                         Colonoscope was introduced through the anus and                         advanced to the the cecum, identified by  appendiceal                         orifice and ileocecal valve. The colonoscopy was                         performed without difficulty. The patient tolerated                         the procedure well. The quality of the bowel                         preparation was adequate. The ileocecal valve,                         appendiceal orifice, and rectum were photographed. Findings:      The perianal and digital rectal examinations were normal. Pertinent       negatives include normal sphincter tone and no palpable rectal lesions.      A 8 mm polyp was found in the cecum. The polyp was sessile. The polyp       was removed with a cold snare. Resection and retrieval were complete.      A 3 mm polyp was found in the sigmoid colon. The polyp was sessile. The       polyp was removed with a cold biopsy forceps. Resection and retrieval       were complete.      There was a small lipoma, 15 mm in diameter, in the proximal ascending       colon. Biopsies were taken with a cold forceps for histology.      Non-bleeding internal hemorrhoids were found during retroflexion. The       hemorrhoids were Grade I (internal hemorrhoids that do not prolapse).      The exam was otherwise without abnormality. Impression:            - One 8 mm polyp in the cecum, removed with a cold                         snare. Resected and retrieved.                        - One 3 mm polyp in the sigmoid colon, removed with a                         cold biopsy forceps. Resected and retrieved.                        -  Small lipoma in the proximal ascending colon.                         Biopsied.                        - The examination was otherwise normal on direct and                         retroflexion views. Recommendation:        - Patient has a contact number available for                         emergencies. The signs and symptoms of potential                         delayed complications were discussed with the  patient.                         Return to normal activities tomorrow. Written                         discharge instructions were provided to the patient.                        - Resume previous diet.                        - Continue present medications.                        - Repeat colonoscopy is recommended for surveillance.                         The colonoscopy date will be determined after                         pathology results from today's exam become available                         for review.                        - Return to GI office PRN.                        - The findings and recommendations were discussed with                         the patient. Procedure Code(s):     --- Professional ---                        [id number], Colonoscopy, flexible; with removal of                         tumor(s), polyp(s), or other lesion(s) by snare                         technique  96283, 21, Colonoscopy, flexible; with biopsy, single                         or multiple Diagnosis Code(s):     --- Professional ---                        D17.5, Benign lipomatous neoplasm of intra-abdominal                         organs                        K63.5, Polyp of colon                        Z86.010, Personal history of colonic polyps CPT copyright 2019 American Medical Association. All rights reserved. The codes documented in this report are preliminary and upon coder review may  be revised to meet current compliance requirements. Efrain Sella MD, MD 12/03/2019 10:05:46 AM This report has been signed electronically. Number of Addenda: 0 Note Initiated On: 12/03/2019 9:20 AM Scope Withdrawal Time: 0 hours 10 minutes 37 seconds  Total Procedure Duration: 0 hours 16 minutes 40 seconds  Estimated Blood Loss:  Estimated blood loss: none. Estimated blood loss: none.      [facility]

## 2019-12-03 NOTE — Anesthesia Preprocedure Evaluation (Signed)
Anesthesia Evaluation  Patient identified by MRN, date of birth, ID band Patient awake    Reviewed: Allergy & Precautions, NPO status , Patient's Chart, lab work & pertinent test results  History of Anesthesia Complications Negative for: history of anesthetic complications  Airway Mallampati: III     Mouth opening: Limited Mouth Opening  Dental   Pulmonary neg sleep apnea, neg COPD, Not current smoker,           Cardiovascular hypertension, Pt. on medications (-) Past MI and (-) CHF (-) dysrhythmias (-) Valvular Problems/Murmurs     Neuro/Psych neg Seizures Depression    GI/Hepatic Neg liver ROS, GERD  Medicated and Controlled,  Endo/Other  neg diabetesHypothyroidism   Renal/GU negative Renal ROS     Musculoskeletal   Abdominal   Peds  Hematology   Anesthesia Other Findings   Reproductive/Obstetrics                             Anesthesia Physical Anesthesia Plan  ASA: III  Anesthesia Plan: General   Post-op Pain Management:    Induction: Intravenous  PONV Risk Score and Plan: 3 and Propofol infusion, TIVA and Treatment may vary due to age or medical condition  Airway Management Planned: Nasal Cannula  Additional Equipment:   Intra-op Plan:   Post-operative Plan:   Informed Consent: I have reviewed the patients History and Physical, chart, labs and discussed the procedure including the risks, benefits and alternatives for the proposed anesthesia with the patient or authorized representative who has indicated his/her understanding and acceptance.       Plan Discussed with:   Anesthesia Plan Comments:         Anesthesia Quick Evaluation

## 2019-12-03 NOTE — Anesthesia Postprocedure Evaluation (Signed)
Anesthesia Post Note  Patient: Carla Mann  Procedure(s) Performed: COLONOSCOPY WITH PROPOFOL (N/A )  Patient location during evaluation: Endoscopy Anesthesia Type: General Level of consciousness: awake and alert Pain management: pain level controlled Vital Signs Assessment: post-procedure vital signs reviewed and stable Respiratory status: spontaneous breathing and respiratory function stable Cardiovascular status: stable Anesthetic complications: no   No complications documented.   Last Vitals:  Vitals:   12/03/19 1010 12/03/19 1020  BP: 115/71 125/71  Pulse: 71 73  Resp: 11 14  Temp:    SpO2: 98% 98%    Last Pain:  Vitals:   12/03/19 1000  TempSrc: Temporal  PainSc:                  Carla Mann

## 2019-12-03 NOTE — Transfer of Care (Signed)
Immediate Anesthesia Transfer of Care Note  Patient: Carla Mann  Procedure(s) Performed: COLONOSCOPY WITH PROPOFOL (N/A )  Patient Location: PACU  Anesthesia Type:General  Level of Consciousness: awake and sedated  Airway & Oxygen Therapy: Patient Spontanous Breathing and Patient connected to nasal cannula oxygen  Post-op Assessment: Report given to RN and Post -op Vital signs reviewed and stable  Post vital signs: Reviewed and stable  Last Vitals:  Vitals Value Taken Time  BP 115/65 12/03/19 1004  Temp 35.9 C 12/03/19 1000  Pulse 73 12/03/19 1004  Resp 11 12/03/19 1004  SpO2 97 % 12/03/19 1004  Vitals shown include unvalidated device data.  Last Pain:  Vitals:   12/03/19 1000  TempSrc: Temporal  PainSc:          Complications: No complications documented.

## 2019-12-03 NOTE — H&P (Signed)
Outpatient short stay form Pre-procedure 12/03/2019 9:14 AM Jeris Roser K. Alice Reichert, M.D.  Primary Physician: Benita Gutter, M.D.  Reason for visit:  Tubular adenomas of the colon x 3 in 2017 colonoscopy.  History of present illness:                            Patient presents for colonoscopy for a personal hx of colon polyps. The patient denies abdominal pain, abnormal weight loss or rectal bleeding.      Current Facility-Administered Medications:  .  0.9 %  sodium chloride infusion, , Intravenous, Continuous, [place], Benay Pike, MD, Last Rate: 20 mL/hr at 12/03/19 0845, New Bag at 12/03/19 0845  Medications Prior to Admission  Medication Sig Dispense Refill Last Dose  . amitriptyline (ELAVIL) 50 MG tablet Take 50 mg by mouth at bedtime.   12/02/2019 at Unknown time  . carvedilol (COREG) 6.25 MG tablet Take 6.25 mg by mouth 2 (two) times daily with a meal.   12/02/2019 at Unknown time  . cholecalciferol (VITAMIN D3) 25 MCG (1000 UNIT) tablet Take 1,000 Units by mouth daily.   Past Week at Unknown time  . DULoxetine (CYMBALTA) 60 MG capsule Take 60 mg by mouth 2 (two) times daily.    12/02/2019 at Unknown time  . gabapentin (NEURONTIN) 600 MG tablet Take 600 mg by mouth 3 (three) times daily.   12/02/2019 at Unknown time  . hydrochlorothiazide (HYDRODIURIL) 25 MG tablet Take 25 mg by mouth daily.   12/02/2019 at Unknown time  . ibuprofen (ADVIL) 200 MG tablet Take 200 mg by mouth every 6 (six) hours as needed.   Past Week at Unknown time  . levothyroxine (SYNTHROID, LEVOTHROID) 112 MCG tablet Take 112 mcg by mouth daily before breakfast.   12/02/2019 at Unknown time  . lisinopril (PRINIVIL,ZESTRIL) 5 MG tablet Take 5 mg by mouth daily.   12/02/2019 at Unknown time  . meloxicam (MOBIC) 7.5 MG tablet Take 7.5 mg by mouth daily.   12/02/2019 at Unknown time  . nortriptyline (PAMELOR) 10 MG capsule Take 30 mg by mouth at bedtime.   12/02/2019 at Unknown time  . omeprazole (PRILOSEC) 20 MG capsule Take 20 mg by  mouth daily.   12/02/2019 at Unknown time  . tizanidine (ZANAFLEX) 2 MG capsule Take 2 mg by mouth 3 (three) times daily.   12/02/2019 at Unknown time     Allergies  Allergen Reactions  . Penicillins      Past Medical History:  Diagnosis Date  . Anginal pain ([place])   . Arthritis    osteoarthritis  . Cancer ([place])    cervical  . Cardiomyopathy ([place])   . CHF (congestive heart failure) ([place])   . Colon adenomas   . COVID-19    06/2019  . Depression   . Esophagitis   . Fibromyalgia   . GERD (gastroesophageal reflux disease)   . History of kidney stones   . Hypothyroidism   . Non-toxic goiter   . Obesity   . Syncope   . Thyroid nodule     Review of systems:  Otherwise negative.    Physical Exam  Gen: Alert, oriented. Appears stated age.  HEENT: [place]/AT. PERRLA. Lungs: CTA, no wheezes. CV: RR nl S1, S2. Abd: soft, benign, no masses. BS+ Ext: No edema. Pulses 2+    Planned procedures: Proceed with colonoscopy. The patient understands the nature of the planned procedure, indications, risks, alternatives and potential complications including but  not limited to bleeding, infection, perforation, damage to internal organs and possible oversedation/side effects from anesthesia. The patient agrees and gives consent to proceed.  Please refer to procedure notes for findings, recommendations and patient disposition/instructions.     Brailynn Breth K. Alice Reichert, M.D. Gastroenterology 12/03/2019  9:14 AM

## 2019-12-03 NOTE — Addendum Note (Signed)
Addendum  created 12/03/19 1140 by Nelda Marseille, CRNA   Intraprocedure Event edited

## 2019-12-04 ENCOUNTER — Encounter: Payer: Self-pay | Admitting: Internal Medicine

## 2019-12-07 LAB — SURGICAL PATHOLOGY

## 2020-05-02 ENCOUNTER — Other Ambulatory Visit: Payer: Self-pay | Admitting: Family Medicine

## 2020-05-02 DIAGNOSIS — Z1231 Encounter for screening mammogram for malignant neoplasm of breast: Secondary | ICD-10-CM

## 2020-06-02 ENCOUNTER — Ambulatory Visit
Admission: RE | Admit: 2020-06-02 | Discharge: 2020-06-02 | Disposition: A | Discharge status: Home / Self Care | Payer: BC Managed Care – PPO | Source: Ambulatory Visit | Attending: Family Medicine | Admitting: Family Medicine

## 2020-06-02 ENCOUNTER — Other Ambulatory Visit: Payer: Self-pay

## 2020-06-02 DIAGNOSIS — Z1231 Encounter for screening mammogram for malignant neoplasm of breast: Secondary | ICD-10-CM | POA: Insufficient documentation

## 2021-05-10 ENCOUNTER — Other Ambulatory Visit: Payer: Self-pay | Admitting: Family Medicine

## 2021-05-10 DIAGNOSIS — Z1231 Encounter for screening mammogram for malignant neoplasm of breast: Secondary | ICD-10-CM

## 2021-06-05 ENCOUNTER — Ambulatory Visit
Admission: RE | Admit: 2021-06-05 | Discharge: 2021-06-05 | Disposition: A | Discharge status: Home / Self Care | Payer: Medicare Other | Source: Ambulatory Visit | Attending: Family Medicine | Admitting: Family Medicine

## 2021-06-05 ENCOUNTER — Other Ambulatory Visit: Payer: Self-pay

## 2021-06-05 DIAGNOSIS — Z1231 Encounter for screening mammogram for malignant neoplasm of breast: Secondary | ICD-10-CM | POA: Diagnosis not present

## 2021-06-08 ENCOUNTER — Other Ambulatory Visit: Payer: Self-pay | Admitting: Family Medicine

## 2021-06-08 DIAGNOSIS — R921 Mammographic calcification found on diagnostic imaging of breast: Secondary | ICD-10-CM

## 2021-06-08 DIAGNOSIS — R928 Other abnormal and inconclusive findings on diagnostic imaging of breast: Secondary | ICD-10-CM

## 2021-06-14 ENCOUNTER — Ambulatory Visit
Admission: RE | Admit: 2021-06-14 | Discharge: 2021-06-14 | Disposition: A | Discharge status: Home / Self Care | Payer: Medicare Other | Source: Ambulatory Visit | Attending: Family Medicine | Admitting: Family Medicine

## 2021-06-14 ENCOUNTER — Other Ambulatory Visit: Payer: Self-pay

## 2021-06-14 DIAGNOSIS — R921 Mammographic calcification found on diagnostic imaging of breast: Secondary | ICD-10-CM | POA: Diagnosis present

## 2021-06-14 DIAGNOSIS — R928 Other abnormal and inconclusive findings on diagnostic imaging of breast: Secondary | ICD-10-CM | POA: Diagnosis not present

## 2021-06-20 ENCOUNTER — Other Ambulatory Visit: Payer: Self-pay | Admitting: Family Medicine

## 2021-06-20 DIAGNOSIS — R921 Mammographic calcification found on diagnostic imaging of breast: Secondary | ICD-10-CM

## 2021-06-20 DIAGNOSIS — R928 Other abnormal and inconclusive findings on diagnostic imaging of breast: Secondary | ICD-10-CM

## 2021-06-27 ENCOUNTER — Other Ambulatory Visit: Payer: Self-pay

## 2021-06-27 ENCOUNTER — Ambulatory Visit
Admission: RE | Admit: 2021-06-27 | Discharge: 2021-06-27 | Disposition: A | Discharge status: Home / Self Care | Payer: Medicare Other | Source: Ambulatory Visit | Attending: Family Medicine | Admitting: Family Medicine

## 2021-06-27 DIAGNOSIS — R928 Other abnormal and inconclusive findings on diagnostic imaging of breast: Secondary | ICD-10-CM

## 2021-06-27 DIAGNOSIS — R921 Mammographic calcification found on diagnostic imaging of breast: Secondary | ICD-10-CM

## 2021-06-27 HISTORY — PX: BREAST BIOPSY: SHX20

## 2021-06-28 LAB — SURGICAL PATHOLOGY

## 2022-04-16 ENCOUNTER — Other Ambulatory Visit: Payer: Self-pay | Admitting: Family Medicine

## 2022-04-16 DIAGNOSIS — Z1231 Encounter for screening mammogram for malignant neoplasm of breast: Secondary | ICD-10-CM

## 2022-05-25 IMAGING — MG MM BREAST BX W LOC DEV 1ST LESION IMAGE BX SPEC STEREO GUIDE*L*
8 of 10 series · 8 of 22 positions shown · non-contrast
Comparison: Previous exams.
COMPARISON: Previous exams.

Addendum:
CLINICAL DATA: Indeterminate LEFT breast calcifications

EXAM:
LEFT BREAST STEREOTACTIC CORE NEEDLE BIOPSY

[L (1 of 6)]
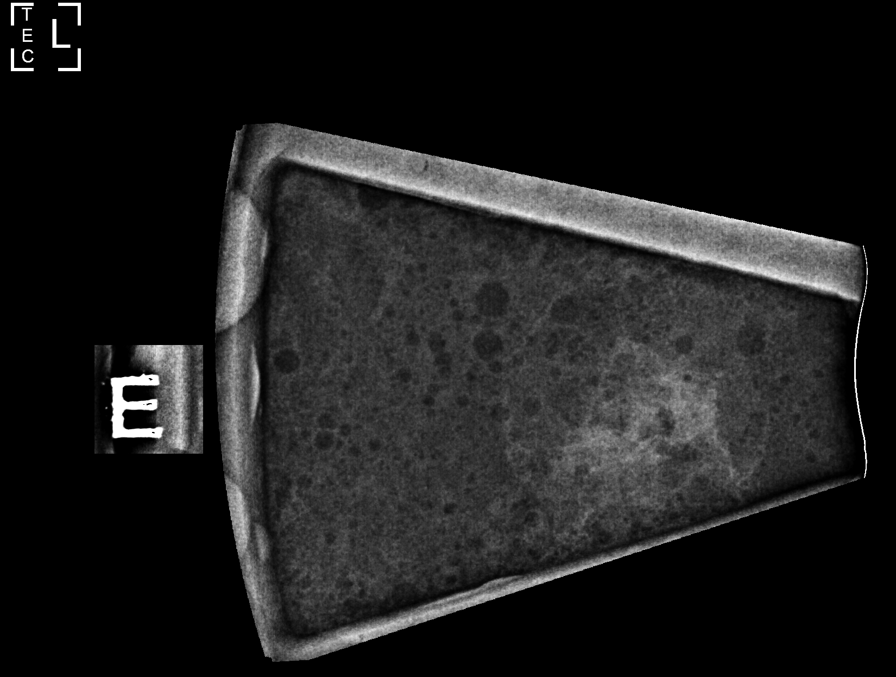

[L (2 of 6)]
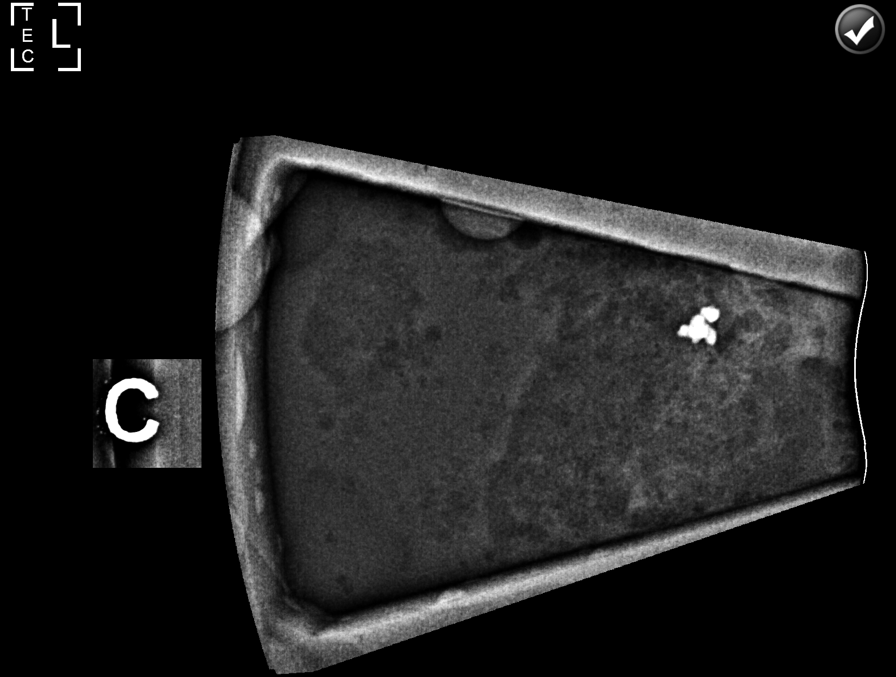

[L (3 of 6)]
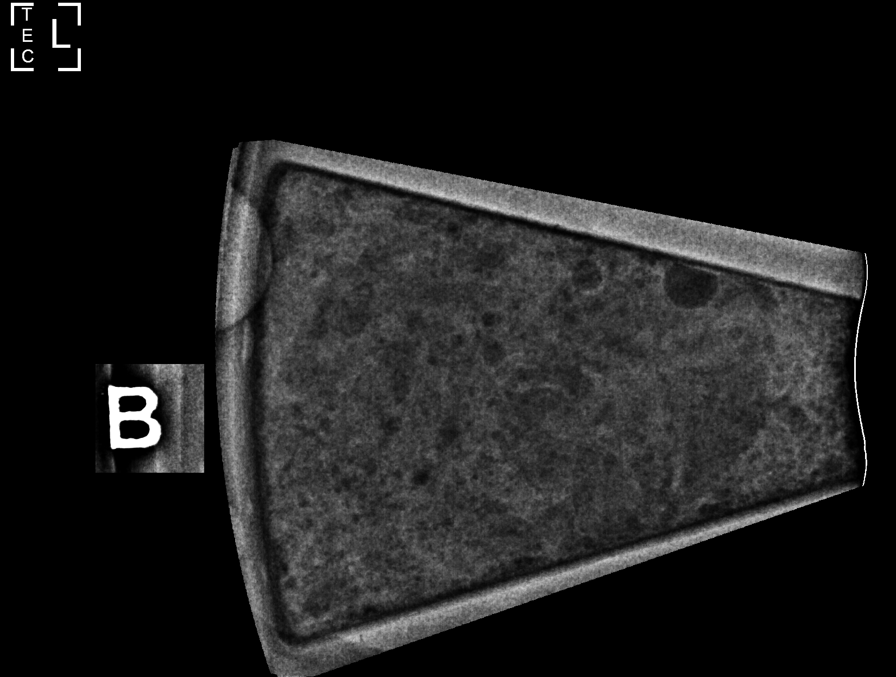

[L (4 of 6)]
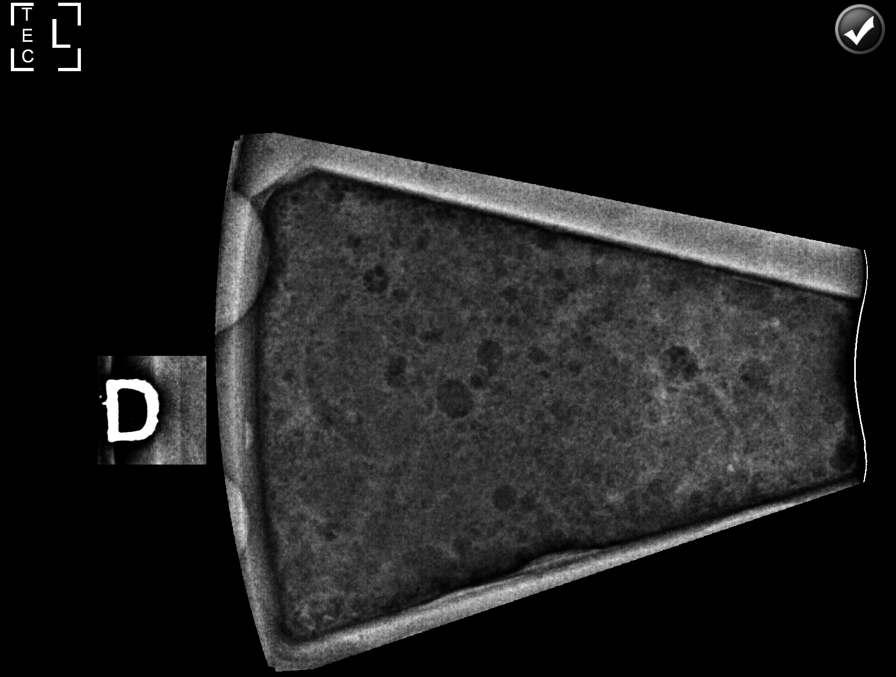

[L (5 of 6)]
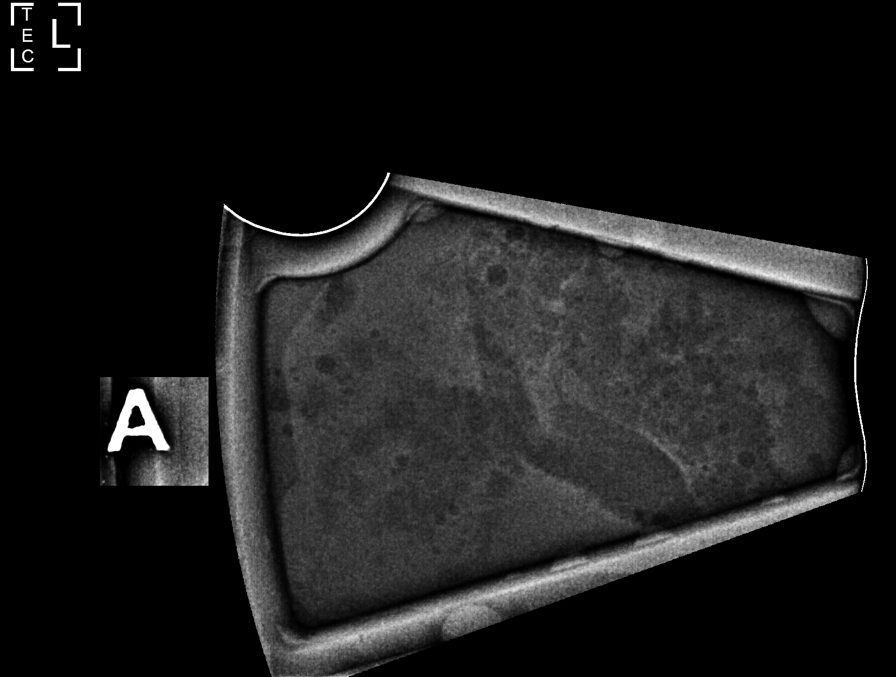

[L (6 of 6)]
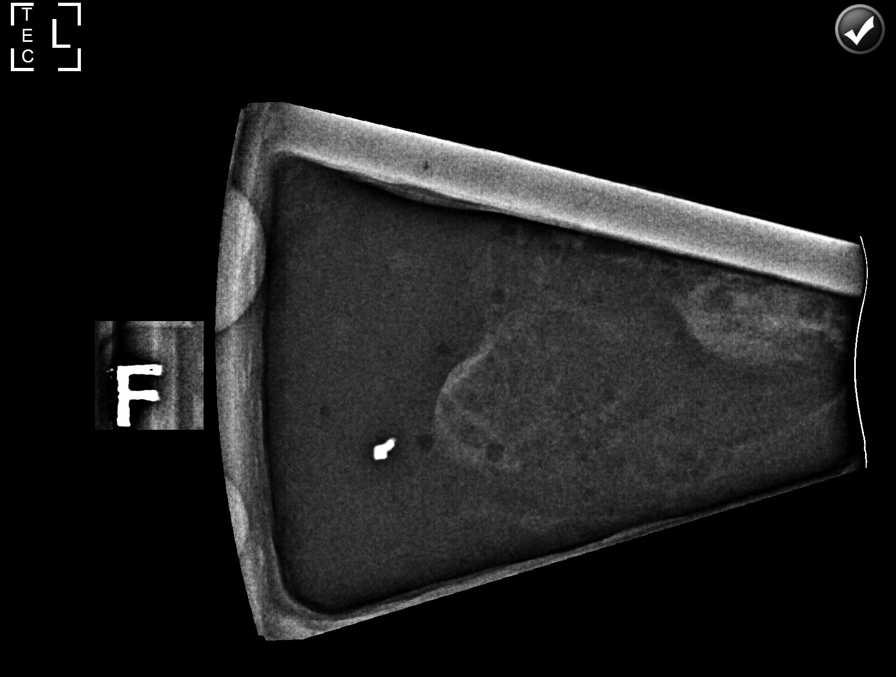

[L LM]
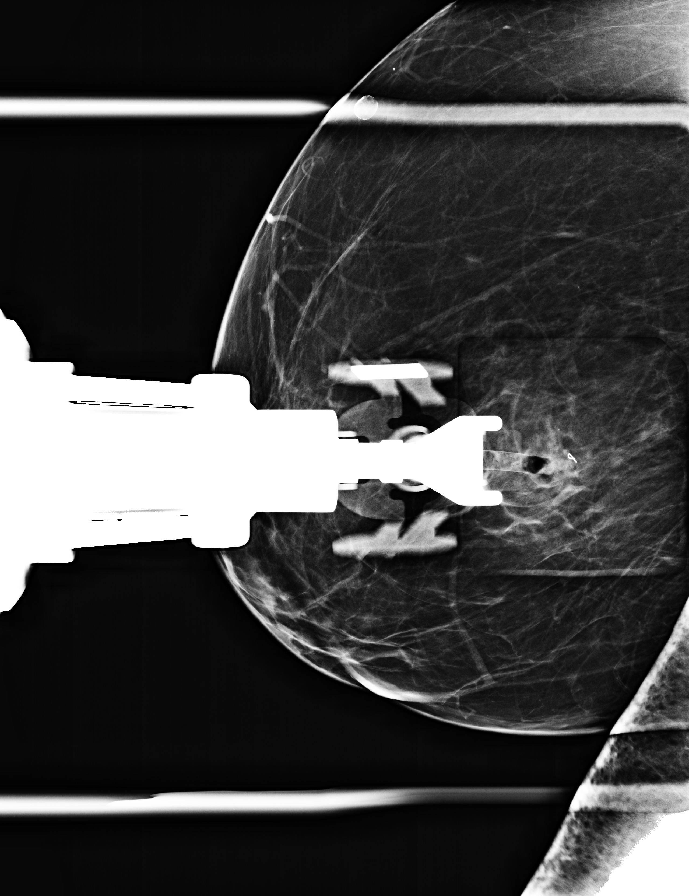

[L LM tomo · tomo slice 36/71.0]
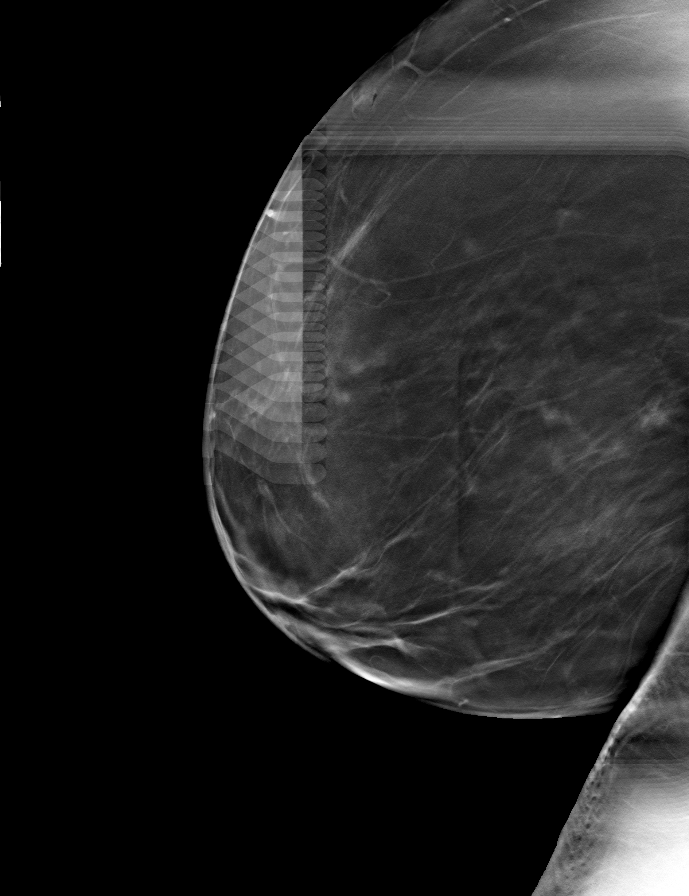

[8 of 22 positions shown; findings below may reference images not displayed]



Using sterile technique and 1% lidocaine and 1% lidocaine with
epinephrine as local anesthetic, under stereotactic guidance, a 9
gauge vacuum assisted device was used to perform core needle biopsy
of calcifications in the lower outer quadrant of the left breast
using a lateral approach. Specimen radiograph was performed showing
representative calcifications in 3 of 6 samples. Specimens with
calcifications are identified for pathology.

Lesion quadrant: Lower outer quadrant

At the conclusion of the procedure, a RIBBON shaped tissue marker
clip was deployed into the biopsy cavity. Follow-up 2-view mammogram
was performed and dictated separately.
IMPRESSION: Stereotactic-guided biopsy of LEFT breast calcifications. No
apparent significant complications. There is a subcentimeter
hematoma at the site of biopsy.

ADDENDUM:
PATHOLOGY revealed: A. BREAST CALCIFICATIONS, LEFT LOWER OUTER
MIDDLE DEPTH; STEREOTACTIC BIOPSY: - FIBROADENOMATOID CHANGE WITH
ASSOCIATED COARSE CALCIFICATIONS. - NEGATIVE FOR ATYPIA AND
MALIGNANCY.

Pathology results are CONCORDANT with imaging findings, per Dr.
Rebnax Ripard.

Pathology results and recommendations were discussed with patient
via telephone on 06/28/2021. Patient reported biopsy site doing well
with no adverse symptoms, and only slight tenderness at the site.
Post biopsy care instructions were reviewed, questions were answered
and my direct phone number was provided. Patient was instructed to
call [HOSPITAL] for any additional questions or concerns
related to biopsy site.

RECOMMENDATION: Patient instructed to continue monthly self breast
examinations and resume annual bilateral screening mammogram due
May 2022.

Pathology results reported by Rajanand Palma RN on 06/29/2021.



Using sterile technique and 1% lidocaine and 1% lidocaine with
epinephrine as local anesthetic, under stereotactic guidance, a 9
gauge vacuum assisted device was used to perform core needle biopsy
of calcifications in the lower outer quadrant of the left breast
using a lateral approach. Specimen radiograph was performed showing
representative calcifications in 3 of 6 samples. Specimens with
calcifications are identified for pathology.

Lesion quadrant: Lower outer quadrant

At the conclusion of the procedure, a RIBBON shaped tissue marker
clip was deployed into the biopsy cavity. Follow-up 2-view mammogram
was performed and dictated separately.
IMPRESSION: Stereotactic-guided biopsy of LEFT breast calcifications. No
apparent significant complications. There is a subcentimeter
hematoma at the site of biopsy.

## 2022-06-06 ENCOUNTER — Ambulatory Visit
Admission: RE | Admit: 2022-06-06 | Discharge: 2022-06-06 | Disposition: A | Discharge status: Home / Self Care | Payer: Medicare Other | Source: Ambulatory Visit | Attending: Family Medicine | Admitting: Family Medicine

## 2022-06-06 DIAGNOSIS — Z1231 Encounter for screening mammogram for malignant neoplasm of breast: Secondary | ICD-10-CM | POA: Insufficient documentation

## 2022-08-01 ENCOUNTER — Other Ambulatory Visit: Payer: Self-pay | Admitting: Family Medicine

## 2022-08-01 DIAGNOSIS — Z1231 Encounter for screening mammogram for malignant neoplasm of breast: Secondary | ICD-10-CM

## 2022-08-13 ENCOUNTER — Other Ambulatory Visit: Payer: Self-pay | Admitting: Physician Assistant

## 2022-08-13 DIAGNOSIS — M12812 Other specific arthropathies, not elsewhere classified, left shoulder: Secondary | ICD-10-CM

## 2022-08-21 ENCOUNTER — Ambulatory Visit
Admission: RE | Admit: 2022-08-21 | Discharge: 2022-08-21 | Disposition: A | Discharge status: Home / Self Care | Payer: Medicare Other | Source: Ambulatory Visit | Attending: Physician Assistant | Admitting: Physician Assistant

## 2022-08-21 DIAGNOSIS — M12812 Other specific arthropathies, not elsewhere classified, left shoulder: Secondary | ICD-10-CM | POA: Insufficient documentation

## 2022-08-21 DIAGNOSIS — M75102 Unspecified rotator cuff tear or rupture of left shoulder, not specified as traumatic: Secondary | ICD-10-CM | POA: Diagnosis present

## 2022-12-04 ENCOUNTER — Emergency Department
Admission: EM | Admit: 2022-12-04 | Discharge: 2022-12-04 | Disposition: A | Discharge status: Home / Self Care | Payer: Medicare Other | Attending: Emergency Medicine | Admitting: Emergency Medicine

## 2022-12-04 ENCOUNTER — Other Ambulatory Visit: Payer: Self-pay

## 2022-12-04 DIAGNOSIS — E039 Hypothyroidism, unspecified: Secondary | ICD-10-CM | POA: Diagnosis not present

## 2022-12-04 DIAGNOSIS — I951 Orthostatic hypotension: Secondary | ICD-10-CM

## 2022-12-04 DIAGNOSIS — R42 Dizziness and giddiness: Secondary | ICD-10-CM | POA: Diagnosis present

## 2022-12-04 DIAGNOSIS — I509 Heart failure, unspecified: Secondary | ICD-10-CM | POA: Insufficient documentation

## 2022-12-04 LAB — BASIC METABOLIC PANEL
Anion gap: 12 (ref 5–15)
BUN: 13 mg/dL (ref 8–23)
CO2: 25 mmol/L (ref 22–32)
Calcium: 9 mg/dL (ref 8.9–10.3)
Chloride: 103 mmol/L (ref 98–111)
Creatinine, Ser: 1.02 mg/dL — ABNORMAL HIGH (ref 0.44–1.00)
GFR, Estimated: >60 mL/min (ref >60)
Glucose, Bld: 117 mg/dL — ABNORMAL HIGH (ref 70–99)
Potassium: 3.2 mmol/L — ABNORMAL LOW (ref 3.5–5.1)
Sodium: 140 mmol/L (ref 135–145)

## 2022-12-04 LAB — CBC
HCT: 36.6 % (ref 36.0–46.0)
Hemoglobin: 11.4 g/dL — ABNORMAL LOW (ref 12.0–15.0)
MCH: 27.5 pg (ref 26.0–34.0)
MCHC: 31.1 g/dL (ref 30.0–36.0)
MCV: 88.4 fL (ref 80.0–100.0)
Platelets: 398 10*3/uL (ref 150–400)
RBC: 4.14 MIL/uL (ref 3.87–5.11)
RDW: 13.5 % (ref 11.5–15.5)
WBC: 9.2 10*3/uL (ref 4.0–10.5)
nRBC: 0 % (ref 0.0–0.2)

## 2022-12-04 MED ORDER — POTASSIUM CHLORIDE CRYS ER 20 MEQ PO TBCR
40.0000 meq | EXTENDED_RELEASE_TABLET | Freq: Once | ORAL | Status: AC
  Administered 2022-12-04: 40 meq via ORAL
  Filled 2022-12-04: qty 2

## 2022-12-04 NOTE — ED Provider Notes (Signed)
[facility] Provider Note    Event Date/Time   First MD Initiated Contact with Patient 12/04/22 1218     (approximate)   History   Chief Complaint Dizziness   HPI  Carla Mann is a 62 y.o. female with past medical history of hypothyroidism, CHF, and fibromyalgia who presents to the ED complaining of dizziness.  Patient reports that she went to her previously scheduled GI appointment earlier today, while walking into the office became dizzy and lightheaded.  They checked her blood pressure during the visit and found her to be hypotensive, with blood pressure as low as 78/53.  She was subsequently referred to the ED for further evaluation.  Patient now states that she feels better, however she had a similar episode 2 days ago while leaving church.  She states that she stood up to walk out of church and became dizzy and lightheaded.  Symptoms eventually resolved with rest and she denies any associated chest pain or shortness of breath.  She has not had any fevers, cough, nausea, vomiting, diarrhea, or dysuria.     Physical Exam   Triage Vital Signs: ED Triage Vitals  Enc Vitals Group     BP 12/04/22 1133 112/68     Pulse Rate 12/04/22 1133 65     Resp 12/04/22 1133 18     Temp 12/04/22 1133 98.3 F (36.8 C)     Temp src --      SpO2 12/04/22 1133 97 %     Weight 12/04/22 1132 243 lb (110.2 kg)     Height 12/04/22 1132 5\' 3"  (1.6 m)     Head Circumference --      Peak Flow --      Pain Score 12/04/22 1132 0     Pain Loc --      Pain Edu? --      Excl. in GC? --     Most recent vital signs: Vitals:   12/04/22 1133 12/04/22 1230  BP: 112/68 118/65  Pulse: 65   Resp: 18 11  Temp: 98.3 F (36.8 C)   SpO2: 97%     Constitutional: Alert and oriented. Eyes: Conjunctivae are normal. Head: Atraumatic. Nose: No congestion/rhinnorhea. Mouth/Throat: Mucous membranes are moist.  Cardiovascular: Normal rate, regular rhythm. Grossly normal heart  sounds.  2+ radial pulses bilaterally. Respiratory: Normal respiratory effort.  No retractions. Lungs CTAB. Gastrointestinal: Soft and nontender. No distention. Musculoskeletal: No lower extremity tenderness nor edema.  Neurologic:  Normal speech and language. No gross focal neurologic deficits are appreciated.    ED Results / Procedures / Treatments   Labs (all labs ordered are listed, but only abnormal results are displayed) Labs Reviewed  CBC - Abnormal; Notable for the following components:      Result Value   Hemoglobin 11.4 (*)    All other components within normal limits  BASIC METABOLIC PANEL - Abnormal; Notable for the following components:   Potassium 3.2 (*)    Glucose, Bld 117 (*)    Creatinine, Ser 1.02 (*)    All other components within normal limits     EKG  ED ECG REPORT I, Chesley Noon, the attending physician, personally viewed and interpreted this ECG.   Date: 12/04/2022  EKG Time: 11:36  Rate: 65  Rhythm: normal sinus rhythm  Axis: LAD  Intervals:none  ST&T Change: None  PROCEDURES:  Critical Care performed: No  Procedures   MEDICATIONS ORDERED IN ED: Medications  potassium chloride SA (KLOR-CON  M) CR tablet 40 mEq (40 mEq Oral Given 12/04/22 1254)     IMPRESSION / MDM / ASSESSMENT AND PLAN / ED COURSE  I reviewed the triage vital signs and the nursing notes.                              62 y.o. female with past medical history of CHF, hypothyroidism, and fibromyalgia who presents to the ED complaining of episode of dizziness and lightheadedness earlier today as well as similar episode yesterday.  Patient's presentation is most consistent with acute presentation with potential threat to life or bodily function.  Differential diagnosis includes, but is not limited to, arrhythmia, ACS, anemia, electrolyte abnormality, AKI, vasovagal episode, orthostatic hypotension.  Patient well-appearing and in no acute distress, vital signs are  unremarkable here in the ED, hypotension improved without intervention.  EKG shows no evidence of arrhythmia or ischemia and labs are reassuring with no significant anemia, leukocytosis, showed abnormality, or AKI.  Orthostatic vital signs were checked and patient briefly dropped blood pressure when standing before the blood pressure improved, she was asymptomatic with this.  Son over the phone confirms that patient frequently does not drink enough water, suspect episode due to orthostatic hypotension.  With reassuring workup here in the ED, patient appropriate for discharge home with outpatient follow-up.  She was counseled to drink plenty of water and to follow-up with her PCP, otherwise return to the ED for new or worsening symptoms.  Patient agrees with plan.      FINAL CLINICAL IMPRESSION(S) / ED DIAGNOSES   Final diagnoses:  Lightheadedness  Orthostatic hypotension     Rx / DC Orders   ED Discharge Orders     None        Note:  This document was prepared using Dragon voice recognition software and may include unintentional dictation errors.   Chesley Noon, MD 12/04/22 1340

## 2022-12-04 NOTE — ED Triage Notes (Signed)
Pt to ED for dizziness for the past few days. States was told hypotensive at doctor.

## 2023-01-23 ENCOUNTER — Other Ambulatory Visit: Payer: Self-pay | Admitting: Orthopedic Surgery

## 2023-01-23 DIAGNOSIS — M4802 Spinal stenosis, cervical region: Secondary | ICD-10-CM

## 2023-01-23 DIAGNOSIS — M5412 Radiculopathy, cervical region: Secondary | ICD-10-CM

## 2023-01-28 ENCOUNTER — Ambulatory Visit
Admission: RE | Admit: 2023-01-28 | Discharge: 2023-01-28 | Disposition: A | Discharge status: Home / Self Care | Payer: Medicare Other | Source: Ambulatory Visit | Attending: Orthopedic Surgery | Admitting: Orthopedic Surgery

## 2023-01-28 DIAGNOSIS — M4802 Spinal stenosis, cervical region: Secondary | ICD-10-CM | POA: Insufficient documentation

## 2023-01-28 DIAGNOSIS — M5412 Radiculopathy, cervical region: Secondary | ICD-10-CM | POA: Diagnosis not present

## 2023-04-05 ENCOUNTER — Ambulatory Visit
Admission: RE | Admit: 2023-04-05 | Discharge: 2023-04-05 | Disposition: A | Discharge status: Home / Self Care | Payer: Medicare Other | Attending: Gastroenterology | Admitting: Gastroenterology

## 2023-04-05 ENCOUNTER — Other Ambulatory Visit: Payer: Self-pay

## 2023-04-05 ENCOUNTER — Ambulatory Visit: Payer: Medicare Other | Admitting: Certified Registered"

## 2023-04-05 ENCOUNTER — Encounter
Admission: RE | Disposition: A | Discharge status: Home / Self Care | Payer: Self-pay | Source: Home / Self Care | Attending: Gastroenterology

## 2023-04-05 ENCOUNTER — Encounter: Payer: Self-pay | Admitting: *Deleted

## 2023-04-05 DIAGNOSIS — M797 Fibromyalgia: Secondary | ICD-10-CM | POA: Insufficient documentation

## 2023-04-05 DIAGNOSIS — E039 Hypothyroidism, unspecified: Secondary | ICD-10-CM | POA: Diagnosis not present

## 2023-04-05 DIAGNOSIS — I110 Hypertensive heart disease with heart failure: Secondary | ICD-10-CM | POA: Diagnosis not present

## 2023-04-05 DIAGNOSIS — E669 Obesity, unspecified: Secondary | ICD-10-CM | POA: Diagnosis not present

## 2023-04-05 DIAGNOSIS — K219 Gastro-esophageal reflux disease without esophagitis: Secondary | ICD-10-CM | POA: Insufficient documentation

## 2023-04-05 DIAGNOSIS — K640 First degree hemorrhoids: Secondary | ICD-10-CM | POA: Insufficient documentation

## 2023-04-05 DIAGNOSIS — D175 Benign lipomatous neoplasm of intra-abdominal organs: Secondary | ICD-10-CM | POA: Diagnosis not present

## 2023-04-05 DIAGNOSIS — Z860101 Personal history of adenomatous and serrated colon polyps: Secondary | ICD-10-CM | POA: Diagnosis not present

## 2023-04-05 DIAGNOSIS — D120 Benign neoplasm of cecum: Secondary | ICD-10-CM | POA: Diagnosis not present

## 2023-04-05 DIAGNOSIS — Z79890 Hormone replacement therapy (postmenopausal): Secondary | ICD-10-CM | POA: Insufficient documentation

## 2023-04-05 DIAGNOSIS — I5022 Chronic systolic (congestive) heart failure: Secondary | ICD-10-CM | POA: Diagnosis not present

## 2023-04-05 DIAGNOSIS — Z09 Encounter for follow-up examination after completed treatment for conditions other than malignant neoplasm: Secondary | ICD-10-CM | POA: Diagnosis present

## 2023-04-05 DIAGNOSIS — Z8541 Personal history of malignant neoplasm of cervix uteri: Secondary | ICD-10-CM | POA: Insufficient documentation

## 2023-04-05 HISTORY — PX: POLYPECTOMY: SHX5525

## 2023-04-05 HISTORY — PX: COLONOSCOPY WITH PROPOFOL: SHX5780

## 2023-04-05 SURGERY — COLONOSCOPY WITH PROPOFOL
Anesthesia: General

## 2023-04-05 MED ORDER — PROPOFOL 500 MG/50ML IV EMUL
INTRAVENOUS | Status: DC | PRN
  Administered 2023-04-05: 140 ug/kg/min via INTRAVENOUS

## 2023-04-05 MED ORDER — SODIUM CHLORIDE 0.9 % IV SOLN: INTRAVENOUS | Status: DC

## 2023-04-05 NOTE — Interval H&P Note (Signed)
History and Physical Interval Note:  04/05/2023 9:02 AM  Carla Mann  has presented today for surgery, with the diagnosis of hx of adenomatous polyp of colon.  The various methods of treatment have been discussed with the patient and family. After consideration of risks, benefits and other options for treatment, the patient has consented to  Procedure(s): COLONOSCOPY WITH PROPOFOL (N/A) as a surgical intervention.  The patient's history has been reviewed, patient examined, no change in status, stable for surgery.  I have reviewed the patient's chart and labs.  Questions were answered to the patient's satisfaction.     Regis Bill  Ok to proceed with colonoscopy

## 2023-04-05 NOTE — H&P (Signed)
Outpatient short stay form Pre-procedure 04/05/2023  Regis Bill, MD  Primary Physician: Manning Charity, NP  Reason for visit:  Surveillance colonoscopy  History of present illness:    62 y/o lady with history of HFrEF, hypothyroidism, obesity, hypertension, and fibromyalgia here for colonoscopy for TVA on last colonoscopy in 2021. No blood thinners. History of hysterectomy. Denies family history of GI malignancies.    Current Facility-Administered Medications:    0.9 %  sodium chloride infusion, , Intravenous, Continuous, Waris Rodger, Rossie Muskrat, MD, Last Rate: 20 mL/hr at 04/05/23 0850, New Bag at 04/05/23 0850  Medications Prior to Admission  Medication Sig Dispense Refill Last Dose   carvedilol (COREG) 6.25 MG tablet Take 6.25 mg by mouth 2 (two) times daily with a meal.   04/04/2023   cholecalciferol (VITAMIN D3) 25 MCG (1000 UNIT) tablet Take 1,000 Units by mouth daily.   04/04/2023   DULoxetine (CYMBALTA) 60 MG capsule Take 60 mg by mouth 2 (two) times daily.    04/04/2023   gabapentin (NEURONTIN) 600 MG tablet Take 600 mg by mouth 3 (three) times daily.   04/04/2023   hydrochlorothiazide (HYDRODIURIL) 25 MG tablet Take 25 mg by mouth daily.   04/04/2023   levothyroxine (SYNTHROID, LEVOTHROID) 112 MCG tablet Take 112 mcg by mouth daily before breakfast.   04/04/2023   lisinopril (PRINIVIL,ZESTRIL) 5 MG tablet Take 5 mg by mouth daily.   04/04/2023   meloxicam (MOBIC) 7.5 MG tablet Take 7.5 mg by mouth daily.   04/04/2023   nortriptyline (PAMELOR) 10 MG capsule Take 30 mg by mouth at bedtime.   04/04/2023   amitriptyline (ELAVIL) 50 MG tablet Take 50 mg by mouth at bedtime.   04/03/2023   ibuprofen (ADVIL) 200 MG tablet Take 200 mg by mouth every 6 (six) hours as needed.      omeprazole (PRILOSEC) 20 MG capsule Take 20 mg by mouth daily.   04/02/2023   tizanidine (ZANAFLEX) 2 MG capsule Take 2 mg by mouth 3 (three) times daily.   04/02/2023     Allergies  Allergen  Reactions   Penicillins      Past Medical History:  Diagnosis Date   Anginal pain (HCC)    Arthritis    osteoarthritis   Cancer (HCC)    cervical   Cardiomyopathy (HCC)    CHF (congestive heart failure) (HCC)    Colon adenomas    COVID-19    06/2019   Depression    Esophagitis    Fibromyalgia    GERD (gastroesophageal reflux disease)    History of kidney stones    Hypothyroidism    Non-toxic goiter    Obesity    Syncope    Thyroid nodule     Review of systems:  Otherwise negative.    Physical Exam  Gen: Alert, oriented. Appears stated age.  HEENT: PERRLA. Lungs: No respiratory distress CV: RRR Abd: soft, benign, no masses Ext: No edema    Planned procedures: Proceed with colonoscopy. The patient understands the nature of the planned procedure, indications, risks, alternatives and potential complications including but not limited to bleeding, infection, perforation, damage to internal organs and possible oversedation/side effects from anesthesia. The patient agrees and gives consent to proceed.  Please refer to procedure notes for findings, recommendations and patient disposition/instructions.     Regis Bill, MD [facility]

## 2023-04-05 NOTE — Transfer of Care (Signed)
Immediate Anesthesia Transfer of Care Note  Patient: Carla Mann  Procedure(s) Performed: COLONOSCOPY WITH PROPOFOL POLYPECTOMY  Patient Location: PACU  Anesthesia Type:General  Level of Consciousness: awake and alert   Airway & Oxygen Therapy: Patient Spontanous Breathing and Patient connected to nasal cannula oxygen  Post-op Assessment: Report given to RN and Post -op Vital signs reviewed and stable  Post vital signs: Reviewed and stable  Last Vitals:  Vitals Value Taken Time  BP 118/60 04/05/23 0934  Temp 35.6 C 04/05/23 0932  Pulse 76 04/05/23 0934  Resp 13 04/05/23 0934  SpO2 100 % 04/05/23 0934  Vitals shown include unfiled device data.  Last Pain:  Vitals:   04/05/23 0933  TempSrc:   PainSc: Asleep         Complications: No notable events documented.

## 2023-04-05 NOTE — Anesthesia Postprocedure Evaluation (Signed)
Anesthesia Post Note  Patient: Carla Mann  Procedure(s) Performed: COLONOSCOPY WITH PROPOFOL POLYPECTOMY  Patient location during evaluation: PACU Anesthesia Type: General Level of consciousness: awake and alert Pain management: pain level controlled Vital Signs Assessment: post-procedure vital signs reviewed and stable Respiratory status: spontaneous breathing, nonlabored ventilation, respiratory function stable and patient connected to nasal cannula oxygen Cardiovascular status: blood pressure returned to baseline and stable Postop Assessment: no apparent nausea or vomiting Anesthetic complications: no   No notable events documented.   Last Vitals:  Vitals:   04/05/23 0933 04/05/23 0943  BP: 118/60 120/78  Pulse: 75 74  Resp: 14 18  Temp:    SpO2: 100% 97%    Last Pain:  Vitals:   04/05/23 0943  TempSrc:   PainSc: 0-No pain                 Yevette Edwards

## 2023-04-05 NOTE — Op Note (Signed)
[facility] Patient Name: Carla Mann Procedure Date: 04/05/2023 8:58 AM MRN: [id number] Account #: [id number] Date of Birth: [birth year] Admit Type: Outpatient Age: 62 Room: [facility] ENDO ROOM 3 Gender: Female Note Status: Finalized Instrument Name: Prentice Docker [id number] Procedure:             Colonoscopy Indications:           Surveillance: Personal history of adenomatous polyps                         on last colonoscopy 3 years ago Providers:             Eather Colas MD, MD Referring MD:          Noberto Retort. Allena Katz (Referring MD) Medicines:             Monitored Anesthesia Care Complications:         No immediate complications. Estimated blood loss:                         Minimal. Procedure:             Pre-Anesthesia Assessment:                        - Prior to the procedure, a History and Physical was                         performed, and patient medications and allergies were                         reviewed. The patient is competent. The risks and                         benefits of the procedure and the sedation options and                         risks were discussed with the patient. All questions                         were answered and informed consent was obtained.                         Patient identification and proposed procedure were                         verified by the physician, the nurse, the                         anesthesiologist, the anesthetist and the technician                         in the endoscopy suite. Mental Status Examination:                         alert and oriented. Airway Examination: normal                         oropharyngeal airway and neck mobility. Respiratory  Examination: clear to auscultation. CV Examination:                         normal. Prophylactic Antibiotics: The patient does not                         require prophylactic antibiotics. Prior                          Anticoagulants: The patient has taken no anticoagulant                         or antiplatelet agents. ASA Grade Assessment: III - A                         patient with severe systemic disease. After reviewing                         the risks and benefits, the patient was deemed in                         satisfactory condition to undergo the procedure. The                         anesthesia plan was to use monitored anesthesia care                         (MAC). Immediately prior to administration of                         medications, the patient was re-assessed for adequacy                         to receive sedatives. The heart rate, respiratory                         rate, oxygen saturations, blood pressure, adequacy of                         pulmonary ventilation, and response to care were                         monitored throughout the procedure. The physical                         status of the patient was re-assessed after the                         procedure.                        After obtaining informed consent, the colonoscope was                         passed under direct vision. Throughout the procedure,                         the patient's blood pressure, pulse, and oxygen  saturations were monitored continuously. The                         Colonoscope was introduced through the anus and                         advanced to the the cecum, identified by appendiceal                         orifice and ileocecal valve. The colonoscopy was                         performed without difficulty. The patient tolerated                         the procedure well. The quality of the bowel                         preparation was good. The ileocecal valve, appendiceal                         orifice, and rectum were photographed. Findings:      The perianal and digital rectal examinations were normal.      A 4 mm polyp was found in the cecum. The polyp  was sessile. The polyp       was removed with a cold snare. Resection and retrieval were complete.       Estimated blood loss was minimal.      There was a medium-sized lipoma, 20 mm in diameter, in the proximal       ascending colon.      Internal hemorrhoids were found during retroflexion. The hemorrhoids       were Grade I (internal hemorrhoids that do not prolapse).      The exam was otherwise without abnormality on direct and retroflexion       views. Impression:            - One 4 mm polyp in the cecum, removed with a cold                         snare. Resected and retrieved.                        - Medium-sized lipoma in the proximal ascending colon.                        - Internal hemorrhoids.                        - The examination was otherwise normal on direct and                         retroflexion views. Recommendation:        - Discharge patient to home.                        - Resume previous diet.                        - Continue present medications.                        -  Await pathology results.                        - Repeat colonoscopy for surveillance based on                         pathology results.                        - Return to referring physician as previously                         scheduled. Procedure Code(s):     --- Professional ---                        [phone], Colonoscopy, flexible; with removal of                         tumor(s), polyp(s), or other lesion(s) by snare                         technique Diagnosis Code(s):     --- Professional ---                        Z86.010, Personal history of colonic polyps                        D12.0, Benign neoplasm of cecum                        D17.5, Benign lipomatous neoplasm of intra-abdominal                         organs                        K64.0, First degree hemorrhoids CPT copyright 2022 American Medical Association. All rights reserved. The codes documented in this report are  preliminary and upon coder review may  be revised to meet current compliance requirements. Eather Colas MD, MD 04/05/2023 9:33:52 AM Number of Addenda: 0 Note Initiated On: 04/05/2023 8:58 AM Scope Withdrawal Time: 0 hours 11 minutes 1 second  Total Procedure Duration: 0 hours 17 minutes 12 seconds  Estimated Blood Loss:  Estimated blood loss was minimal.      [facility]

## 2023-04-05 NOTE — Anesthesia Preprocedure Evaluation (Signed)
Anesthesia Evaluation  Patient identified by MRN, date of birth, ID band Patient awake    Reviewed: Allergy & Precautions, H&P , NPO status , Patient's Chart, lab work & pertinent test results, reviewed documented beta blocker date and time   Airway Mallampati: II   Neck ROM: full    Dental  (+) Poor Dentition   Pulmonary shortness of breath and with exertion   Pulmonary exam normal        Cardiovascular Exercise Tolerance: Poor + angina with exertion +CHF  Normal cardiovascular exam Rhythm:regular Rate:Normal     Neuro/Psych  PSYCHIATRIC DISORDERS  Depression     Neuromuscular disease    GI/Hepatic Neg liver ROS,GERD  Medicated,,  Endo/Other  Hypothyroidism  Morbid obesity  Renal/GU negative Renal ROS  negative genitourinary   Musculoskeletal   Abdominal   Peds  Hematology negative hematology ROS (+)   Anesthesia Other Findings Past Medical History: No date: Anginal pain (HCC) No date: Arthritis     Comment:  osteoarthritis No date: Cancer (HCC)     Comment:  cervical No date: Cardiomyopathy (HCC) No date: CHF (congestive heart failure) (HCC) No date: Colon adenomas No date: COVID-19     Comment:  06/2019 No date: Depression No date: Esophagitis No date: Fibromyalgia No date: GERD (gastroesophageal reflux disease) No date: History of kidney stones No date: Hypothyroidism No date: Non-toxic goiter No date: Obesity No date: Syncope No date: Thyroid nodule Past Surgical History: No date: ABDOMINAL HYSTERECTOMY 06/27/2021: BREAST BIOPSY; Left     Comment:  left breast stereo calcs, ribbon marker, path pending No date: CARDIAC CATHETERIZATION 08/12/2015: COLONOSCOPY WITH PROPOFOL; N/A     Comment:  Procedure: COLONOSCOPY WITH PROPOFOL;  Surgeon: Scot Jun, MD;  Location: [facility] ENDOSCOPY;  Service:               Endoscopy;  Laterality: N/A; 12/03/2019: COLONOSCOPY WITH PROPOFOL;  N/A     Comment:  Procedure: COLONOSCOPY WITH PROPOFOL;  Surgeon: [place],               Boykin Nearing, MD;  Location: [facility] ENDOSCOPY;  Service:               Gastroenterology;  Laterality: N/A; 08/12/2015: ESOPHAGOGASTRODUODENOSCOPY (EGD) WITH PROPOFOL; N/A     Comment:  Procedure: ESOPHAGOGASTRODUODENOSCOPY (EGD) WITH               PROPOFOL;  Surgeon: Scot Jun, MD;  [facility]              ENDOSCOPY;  Service: Endoscopy;  Laterality: N/A; BMI    Body Mass Index: 43.22 kg/m     Reproductive/Obstetrics negative OB ROS                             Anesthesia Physical Anesthesia Plan  ASA: 3  Anesthesia Plan: General   Post-op Pain Management:    Induction:   PONV Risk Score and Plan:   Airway Management Planned:   Additional Equipment:   Intra-op Plan:   Post-operative Plan:   Informed Consent: I have reviewed the patients History and Physical, chart, labs and discussed the procedure including the risks, benefits and alternatives for the proposed anesthesia with the patient or authorized representative who has indicated his/her understanding and acceptance.     Dental Advisory Given  Plan Discussed with: CRNA  Anesthesia Plan Comments:  Anesthesia Quick Evaluation

## 2023-04-06 NOTE — Progress Notes (Signed)
Voicemail.  No Message Left. 

## 2023-04-07 NOTE — Plan of Care (Signed)
CHL Tonsillectomy/Adenoidectomy, Postoperative PEDS care plan entered in error.

## 2023-04-08 LAB — SURGICAL PATHOLOGY

## 2023-06-10 ENCOUNTER — Ambulatory Visit
Admission: RE | Admit: 2023-06-10 | Discharge: 2023-06-10 | Disposition: A | Discharge status: Home / Self Care | Payer: Medicare Other | Source: Ambulatory Visit | Attending: Family Medicine | Admitting: Family Medicine

## 2023-06-10 DIAGNOSIS — Z1231 Encounter for screening mammogram for malignant neoplasm of breast: Secondary | ICD-10-CM | POA: Diagnosis present

## 2024-01-03 ENCOUNTER — Other Ambulatory Visit: Payer: Self-pay | Admitting: Family Medicine

## 2024-01-03 DIAGNOSIS — M810 Age-related osteoporosis without current pathological fracture: Secondary | ICD-10-CM

## 2024-01-06 ENCOUNTER — Emergency Department

## 2024-01-06 ENCOUNTER — Other Ambulatory Visit: Payer: Self-pay

## 2024-01-06 ENCOUNTER — Emergency Department: Admission: EM | Admit: 2024-01-06 | Discharge: 2024-01-07 | Disposition: A | Discharge status: Home / Self Care

## 2024-01-06 ENCOUNTER — Ambulatory Visit
Admission: EM | Admit: 2024-01-06 | Discharge: 2024-01-06 | Disposition: A | Discharge status: Other Acute Inpatient Hospital | Source: Home / Self Care

## 2024-01-06 DIAGNOSIS — R441 Visual hallucinations: Secondary | ICD-10-CM

## 2024-01-06 DIAGNOSIS — R4182 Altered mental status, unspecified: Secondary | ICD-10-CM | POA: Diagnosis present

## 2024-01-06 DIAGNOSIS — F22 Delusional disorders: Secondary | ICD-10-CM | POA: Insufficient documentation

## 2024-01-06 DIAGNOSIS — I509 Heart failure, unspecified: Secondary | ICD-10-CM | POA: Insufficient documentation

## 2024-01-06 DIAGNOSIS — R440 Auditory hallucinations: Secondary | ICD-10-CM

## 2024-01-06 DIAGNOSIS — Z8541 Personal history of malignant neoplasm of cervix uteri: Secondary | ICD-10-CM | POA: Diagnosis not present

## 2024-01-06 DIAGNOSIS — D72829 Elevated white blood cell count, unspecified: Secondary | ICD-10-CM | POA: Insufficient documentation

## 2024-01-06 DIAGNOSIS — E876 Hypokalemia: Secondary | ICD-10-CM | POA: Insufficient documentation

## 2024-01-06 LAB — CBC
HCT: 34.6 % — ABNORMAL LOW (ref 36.0–46.0)
Hemoglobin: 11.4 g/dL — ABNORMAL LOW (ref 12.0–15.0)
MCH: 27.7 pg (ref 26.0–34.0)
MCHC: 32.9 g/dL (ref 30.0–36.0)
MCV: 84 fL (ref 80.0–100.0)
Platelets: 419 K/uL — ABNORMAL HIGH (ref 150–400)
RBC: 4.12 MIL/uL (ref 3.87–5.11)
RDW: 14 % (ref 11.5–15.5)
WBC: 12.2 K/uL — ABNORMAL HIGH (ref 4.0–10.5)
nRBC: 0 % (ref 0.0–0.2)

## 2024-01-06 LAB — URINALYSIS, COMPLETE (UACMP) WITH MICROSCOPIC
Bacteria, UA: NONE SEEN
Bilirubin Urine: NEGATIVE
Glucose, UA: NEGATIVE mg/dL
Hgb urine dipstick: NEGATIVE
Ketones, ur: NEGATIVE mg/dL
Leukocytes,Ua: NEGATIVE
Nitrite: NEGATIVE
Protein, ur: NEGATIVE mg/dL
Specific Gravity, Urine: 1.002 — ABNORMAL LOW (ref 1.005–1.030)
Squamous Epithelial / HPF: 0 /HPF (ref 0–5)
pH: 7 (ref 5.0–8.0)

## 2024-01-06 LAB — COMPREHENSIVE METABOLIC PANEL WITH GFR
ALT: 24 U/L (ref 0–44)
AST: 25 U/L (ref 15–41)
Albumin: 4.1 g/dL (ref 3.5–5.0)
Alkaline Phosphatase: 109 U/L (ref 38–126)
Anion gap: 15 (ref 5–15)
BUN: 11 mg/dL (ref 8–23)
CO2: 24 mmol/L (ref 22–32)
Calcium: 9.5 mg/dL (ref 8.9–10.3)
Chloride: 97 mmol/L — ABNORMAL LOW (ref 98–111)
Creatinine, Ser: 1.12 mg/dL — ABNORMAL HIGH (ref 0.44–1.00)
GFR, Estimated: 55 mL/min — ABNORMAL LOW (ref >60)
Glucose, Bld: 95 mg/dL (ref 70–99)
Potassium: 3 mmol/L — ABNORMAL LOW (ref 3.5–5.1)
Sodium: 136 mmol/L (ref 135–145)
Total Bilirubin: 1.2 mg/dL (ref 0.0–1.2)
Total Protein: 7.5 g/dL (ref 6.5–8.1)

## 2024-01-06 LAB — URINALYSIS, W/ REFLEX TO CULTURE (INFECTION SUSPECTED)
Glucose, UA: NEGATIVE mg/dL
Ketones, ur: 15 mg/dL — AB
Leukocytes,Ua: NEGATIVE
Nitrite: NEGATIVE
Protein, ur: NEGATIVE mg/dL
Specific Gravity, Urine: <1.005 — ABNORMAL LOW (ref 1.005–1.030)
pH: 6 (ref 5.0–8.0)

## 2024-01-06 LAB — T4, FREE: Free T4: 1.43 ng/dL — ABNORMAL HIGH (ref 0.61–1.12)

## 2024-01-06 LAB — AMMONIA: Ammonia: <13 umol/L (ref 9–35)

## 2024-01-06 LAB — URINE DRUG SCREEN, QUALITATIVE (ARMC ONLY)
Amphetamines, Ur Screen: NOT DETECTED
Barbiturates, Ur Screen: NOT DETECTED
Benzodiazepine, Ur Scrn: NOT DETECTED
Cannabinoid 50 Ng, Ur ~~LOC~~: NOT DETECTED
Cocaine Metabolite,Ur ~~LOC~~: NOT DETECTED
MDMA (Ecstasy)Ur Screen: NOT DETECTED
Methadone Scn, Ur: NOT DETECTED
Opiate, Ur Screen: NOT DETECTED
Phencyclidine (PCP) Ur S: NOT DETECTED
Tricyclic, Ur Screen: POSITIVE — AB

## 2024-01-06 LAB — MAGNESIUM: Magnesium: 2.4 mg/dL (ref 1.7–2.4)

## 2024-01-06 LAB — ETHANOL: Alcohol, Ethyl (B): <15 mg/dL (ref <15)

## 2024-01-06 LAB — TSH: TSH: 6.944 u[IU]/mL — ABNORMAL HIGH (ref 0.350–4.500)

## 2024-01-06 MED ORDER — LEVOTHYROXINE SODIUM 125 MCG PO TABS
125.0000 ug | ORAL_TABLET | Freq: Every day | ORAL | Status: DC
  Administered 2024-01-07: 125 ug via ORAL
  Filled 2024-01-06: qty 1

## 2024-01-06 MED ORDER — CARVEDILOL 6.25 MG PO TABS
6.2500 mg | ORAL_TABLET | Freq: Two times a day (BID) | ORAL | Status: DC
  Administered 2024-01-07: 6.25 mg via ORAL
  Filled 2024-01-06: qty 1

## 2024-01-06 MED ORDER — GABAPENTIN 300 MG PO CAPS
600.0000 mg | ORAL_CAPSULE | Freq: Three times a day (TID) | ORAL | Status: DC
  Administered 2024-01-07 (×2): 600 mg via ORAL
  Filled 2024-01-06 (×2): qty 2

## 2024-01-06 MED ORDER — GADOBUTROL 1 MMOL/ML IV SOLN
10.0000 mL | Freq: Once | INTRAVENOUS | Status: AC | PRN
  Administered 2024-01-06: 10 mL via INTRAVENOUS

## 2024-01-06 MED ORDER — VITAMIN D 25 MCG (1000 UNIT) PO TABS
1000.0000 [IU] | ORAL_TABLET | Freq: Every day | ORAL | Status: DC
  Administered 2024-01-07: 1000 [IU] via ORAL
  Filled 2024-01-06: qty 1

## 2024-01-06 MED ORDER — ALBUTEROL SULFATE HFA 108 (90 BASE) MCG/ACT IN AERS: 1.0000 | INHALATION_SPRAY | RESPIRATORY_TRACT | Status: DC | PRN

## 2024-01-06 MED ORDER — HYDROCHLOROTHIAZIDE 25 MG PO TABS
25.0000 mg | ORAL_TABLET | Freq: Every day | ORAL | Status: DC
  Administered 2024-01-07: 25 mg via ORAL
  Filled 2024-01-06: qty 1

## 2024-01-06 MED ORDER — LISINOPRIL 5 MG PO TABS
5.0000 mg | ORAL_TABLET | Freq: Every day | ORAL | Status: DC
  Administered 2024-01-07: 5 mg via ORAL
  Filled 2024-01-06: qty 1

## 2024-01-06 MED ORDER — FAMOTIDINE 20 MG PO TABS
20.0000 mg | ORAL_TABLET | Freq: Two times a day (BID) | ORAL | Status: DC
  Administered 2024-01-07 (×2): 20 mg via ORAL
  Filled 2024-01-06 (×3): qty 1

## 2024-01-06 MED ORDER — POTASSIUM CHLORIDE CRYS ER 20 MEQ PO TBCR
40.0000 meq | EXTENDED_RELEASE_TABLET | Freq: Once | ORAL | Status: AC
  Administered 2024-01-06: 40 meq via ORAL
  Filled 2024-01-06: qty 2

## 2024-01-06 MED ORDER — ATORVASTATIN CALCIUM 20 MG PO TABS
20.0000 mg | ORAL_TABLET | Freq: Every day | ORAL | Status: DC
  Administered 2024-01-07: 20 mg via ORAL
  Filled 2024-01-06: qty 1

## 2024-01-06 MED ORDER — PANTOPRAZOLE SODIUM 40 MG PO TBEC
40.0000 mg | DELAYED_RELEASE_TABLET | Freq: Every day | ORAL | Status: DC
  Administered 2024-01-07: 40 mg via ORAL
  Filled 2024-01-06: qty 1

## 2024-01-06 MED ORDER — MIDAZOLAM HCL 2 MG/2ML IJ SOLN: 1.0000 mg | Freq: Once | INTRAMUSCULAR | Status: DC | PRN

## 2024-01-06 MED ORDER — IBUPROFEN 400 MG PO TABS: 200.0000 mg | ORAL_TABLET | Freq: Four times a day (QID) | ORAL | Status: DC | PRN

## 2024-01-06 NOTE — ED Notes (Signed)
 PHARMACY CALLED FOR MEDICATION VALIDATION

## 2024-01-06 NOTE — ED Triage Notes (Signed)
 Pt c/o rash all over body x1 day. No OTC meds.

## 2024-01-06 NOTE — ED Notes (Addendum)
 Carla Mann, [phone], called to check in with patient's condition and states is patient's daughter. This RN advised caller of privacy laws and that information could not be given out at this time. Will notify patient of this upon pt coming back from MRI.

## 2024-01-06 NOTE — ED Triage Notes (Signed)
 Pt reports that last Wednesday pt felt like someone was in the house. PD came out to the house and no one was found. Later that week pt reports that she was seeing and hearing things that were not there. Denies any medication changes or medication errors prior to the hallucinations. Denies fever, chills, urinary symptoms or other illness. A/Ox4 in triage.

## 2024-01-06 NOTE — ED Notes (Signed)
 Patient is being discharged from the Urgent Care and sent to the Emergency Department via EMS . Per Lyle Host PA, patient is in need of higher level of care due to visual hallucinations. Patient is aware and verbalizes understanding of plan of care.  Vitals:   01/06/24 1207  BP: (!) 140/121  Pulse: 68  Resp: 16  Temp: 99.1 F (37.3 C)  SpO2: 95%

## 2024-01-06 NOTE — ED Notes (Addendum)
 Pt now medically cleared and can be changed out and moved to psych area for consult.

## 2024-01-06 NOTE — ED Notes (Signed)
 Pt to bathroom w/ stand by assist. Pt back in bed on cardiac monitor. VSS.

## 2024-01-06 NOTE — ED Provider Notes (Addendum)
 [facility] Provider Note    Event Date/Time   First MD Initiated Contact with Patient 01/06/24 1920     (approximate)   History   Hallucinations (/)   HPI  Carla Mann is a 63 y.o. female with a past medical history of nonischemic dilated cardiomyopathy, CHF, known thyroid  nodule, malignant neoplasm of the cervix, GERD, fibromyalgia and major depressive disorder who presents to the emergency department with 1 week of both auditory and visual hallucinations.  Patient tells me that her husband died 2 years ago and since that she has felt sad and lives at home alone.  She has called police to her residence on 2 occasions as she had thought that there was a intruders in her home.  She is experiencing seeing bugs intermittently and is intermittently hearing voices of people that are not there.  She denies any SI or HI.  She denies a previous history of inpatient psychiatric hospitalization.  She does not see a psychiatrist/behavioral health specialist at baseline.  She reports no prior history of hallucinations      Physical Exam   Triage Vital Signs: ED Triage Vitals  Encounter Vitals Group     BP 01/06/24 1504 (!) 150/78     Girls Systolic BP Percentile --      Girls Diastolic BP Percentile --      Boys Systolic BP Percentile --      Boys Diastolic BP Percentile --      Pulse Rate 01/06/24 1504 65     Resp 01/06/24 1504 20     Temp 01/06/24 1504 99.3 F (37.4 C)     Temp Source 01/06/24 1504 Oral     SpO2 01/06/24 1504 100 %     Weight 01/06/24 1506 260 lb (117.9 kg)     Height 01/06/24 1506 5' 4 (1.626 m)     Head Circumference --      Peak Flow --      Pain Score 01/06/24 1937 0     Pain Loc --      Pain Education --      Exclude from Growth Chart --     Most recent vital signs: Vitals:   01/06/24 1504 01/06/24 1937  BP: (!) 150/78 133/73  Pulse: 65 68  Resp: 20 20  Temp: 99.3 F (37.4 C) 99.3 F (37.4 C)  SpO2: 100% 98%     Nursing Triage Note reviewed. Vital signs reviewed and patients oxygen saturation is normoxic  General: Patient is well nourished, well developed, awake and alert, resting comfortably in no acute distress Head: Normocephalic and atraumatic Eyes: Normal inspection, extraocular muscles intact, no conjunctival pallor Ear, nose, throat: Normal external exam Neck: Normal range of motion Respiratory: Patient is in no respiratory distress, lungs CTAB Cardiovascular: Patient is not tachycardic, RRR without murmur appreciated GI: Abd SNT with no guarding or rebound  Back: Normal inspection of the back with good strength and range of motion throughout all ext Extremities: pulses intact with good cap refills, no LE pitting edema or calf tenderness Neuro: The patient is alert and oriented to person, place, and time, appropriately conversive, with 5/5 bilat UE/LE strength, no gross motor or sensory defects noted. Coordination appears to be adequate.  Able to ambulate without ataxia Skin: Warm, dry, and intact Psych: Depressed mood, tangential thought process, intermittently seems to be responding to internal stimuli, no SI HI  ED Results / Procedures / Treatments   Labs (all labs ordered are  listed, but only abnormal results are displayed) Labs Reviewed  COMPREHENSIVE METABOLIC PANEL WITH GFR - Abnormal; Notable for the following components:      Result Value   Potassium 3.0 (*)    Chloride 97 (*)    Creatinine, Ser 1.12 (*)    GFR, Estimated 55 (*)    All other components within normal limits  CBC - Abnormal; Notable for the following components:   WBC 12.2 (*)    Hemoglobin 11.4 (*)    HCT 34.6 (*)    Platelets 419 (*)    All other components within normal limits  URINE DRUG SCREEN, QUALITATIVE ([facility] ONLY) - Abnormal; Notable for the following components:   Tricyclic, Ur Screen POSITIVE (*)    All other components within normal limits  BLOOD GAS, VENOUS - Abnormal; Notable for the  following components:   pH, Ven 7.45 (*)    pCO2, Ven 36 (*)    All other components within normal limits  TSH - Abnormal; Notable for the following components:   TSH 6.944 (*)    All other components within normal limits  T4, FREE - Abnormal; Notable for the following components:   Free T4 1.43 (*)    All other components within normal limits  URINALYSIS, COMPLETE (UACMP) WITH MICROSCOPIC - Abnormal; Notable for the following components:   Color, Urine STRAW (*)    APPearance CLEAR (*)    Specific Gravity, Urine 1.002 (*)    All other components within normal limits  ETHANOL  AMMONIA  MAGNESIUM     EKG EKG and rhythm strip are interpreted by myself:   EKG: [Normal sinus rhythm] at heart rate of 67, normal QRS duration, QTc 456, nonspecific ST segments and T waves no ectopy EKG not consistent with Acute STEMI Rhythm strip: NSR in lead II   RADIOLOGY MRI brain with and without contrast: No acute abnormality except for chronic vascular disease on my interpretation radiologist agrees    PROCEDURES:  Critical Care performed: No  Procedures   MEDICATIONS ORDERED IN ED: Medications  midazolam  (VERSED ) injection 1 mg (has no administration in time range)  albuterol  (VENTOLIN  HFA) 108 (90 Base) MCG/ACT inhaler 1-2 puff (has no administration in time range)  atorvastatin  (LIPITOR) tablet 20 mg (has no administration in time range)  carvedilol  (COREG ) tablet 6.25 mg (has no administration in time range)  cholecalciferol  (VITAMIN D3) 25 MCG (1000 UNIT) tablet 1,000 Units (has no administration in time range)  famotidine  (PEPCID ) tablet 20 mg (has no administration in time range)  gabapentin  (NEURONTIN ) tablet 600 mg (has no administration in time range)  hydrochlorothiazide  (HYDRODIURIL ) tablet 25 mg (has no administration in time range)  ibuprofen  (ADVIL ) tablet 200 mg (has no administration in time range)  levothyroxine  (SYNTHROID ) tablet 112 mcg (has no administration in  time range)  lisinopril  (ZESTRIL ) tablet 5 mg (has no administration in time range)  pantoprazole  (PROTONIX ) EC tablet 40 mg (has no administration in time range)  potassium chloride  SA (KLOR-CON  M) CR tablet 40 mEq (40 mEq Oral Given 01/06/24 1936)  gadobutrol  (GADAVIST ) 1 MMOL/ML injection 10 mL (10 mLs Intravenous Contrast Given 01/06/24 2131)     IMPRESSION / MDM / ASSESSMENT AND PLAN / ED COURSE                                Differential diagnosis includes, but is not limited to, organic psychiatric disorder, acute encephalopathy, hypercarbia, elevated  ammonia, brain malignancy  ED course: Patient presents and has no acute focal neurological deficits.  I have reviewed the patient's chart and I do not see any prior psychiatric decompensation.  Although she certainly has reason to have an exacerbation of her major depressive disorder (possibly with psychotic features), given her lack of prior presentations I did pursue a broader workup.  She had no profound electrolyte derangements except for potassium which was repleted.  Thyroid  was subclinical consistent with prior history.  She is not hypercarbic and her ammonia level is not elevated.  MRI brain with and without contrast is pending along with a urinalysis.  If this is unremarkable I would clear the patient for psychiatric assessment  10:49 PM on 01/06/2024: Urinalysis is pending however patient denies any dysuria and at this time I do think patient is medically cleared for psychiatric disposition  Clinical Course as of 01/06/24 2330  Mon Jan 06, 2024  1921 Potassium(!): 3.0 Will need repleation [HD]  1921 Tricyclic, Ur Screen(!): POSITIVE [HD]  1921 Alcohol, Ethyl (B): <15 [HD]  2055 WBC(!): 12.2 Mild leukocytosis but patient afebrile [HD]  2055 T4,Free(Direct)(!): 1.43 [HD]  2056 pH, Ven(!): 7.45 Not acidotic [HD]  2057 TSH(!): 6.944 [HD]  2057 T4,Free(Direct)(!): 1.43 Patient has subclinical hypothyroidism.  Given the degree,  I do not think this would be responsible for patient's auditory and visual hallucinations [HD]  2324 Urinalysis, Complete w Microscopic -Urine, Clean Catch(!) UA unremarkbale [HD]  2325 Medically cleared - history of depression, no with visual and auditory hallucinations. No history of psych hospitalizations.  Recent changes to home medications. Subclinical hypothyroidism - on thyroid  medications. MRI neg. Plan for psych eval.  [SM]    Clinical Course User Index [HD] Nicholaus Rolland BRAVO, MD [SM] Suzanne Kirsch, MD   Risk: 5 This patient has a high risk of morbidity due to further diagnostic testing or treatment. Rationale: This patient's evaluation and management involve a high risk of morbidity due to the potential severity of presenting symptoms, need for diagnostic testing, and/or initiation of treatment that may require close monitoring. The differential includes conditions with potential for significant deterioration or requiring escalation of care. Treatment decisions in the ED, including medication administration, procedural interventions, or disposition planning, reflect this level of risk. Additional Support: -- Drug therapy requiring intensive monitoring for toxicity [ ]  -- Decision regarding elective major surgery with idenitified patient or procedure risk factors [ ]  -- Decision regarding hospitalization or escalation of hospital-level care [ ]  -- Decision not to resuscitate or to de-escalate care because of poor prognosis [ ]  -- Parental controlled substances [ ]   COPA: 5 The patient has a severe exacerbation, progression, or side effect of treatment of the following illness/illnesses: []  OR  The patient has the following acute or chronic illness/injury that poses a possible threat to life or bodily function: [X] : The patient has a potentially serious acute condition or an acute exacerbation of a chronic illness requiring urgent evaluation and management in the Emergency Department.  The clinical presentation necessitates immediate consideration of life-threatening or function-threatening diagnoses, even if they are ultimately ruled out.  Data(2/3 categories following were performed): 5 I reviewed or ordered at least three unique tests, external notes, and/or the history required an independent historian as one of the three requirements as following: CBC, BMP, urine drug screen, ammonia AND  I independently interpreted the following test: MRI brain with and without contrast OR  I discussed the management of the patient with the following external  physician or qualified healthcare provider: Behavioral health provider    Suggested E/M Coding Level: 5, [phone], This has been selected based on the January 24, 2022 CPT guidelines for E/M codes in the Emergency Department based on 2/3 of the CoPA, Data, and Risk.   FINAL CLINICAL IMPRESSION(S) / ED DIAGNOSES   Final diagnoses:  Auditory hallucinations  Visual hallucinations  Delusions (HCC)     Rx / DC Orders   ED Discharge Orders     None        Note:  This document was prepared using Dragon voice recognition software and may include unintentional dictation errors.   Nicholaus Rolland BRAVO, MD 01/06/24 2249    Nicholaus Rolland BRAVO, MD 01/06/24 [phone]

## 2024-01-06 NOTE — ED Provider Notes (Signed)
 [facility]    CSN: [id number] Arrival date & time: 01/06/24  1142      History   Chief Complaint Chief Complaint  Patient presents with   Rash    HPI Carla Mann is a 63 y.o. female with history of cardiomyopathy, CHF, fibromyalgia, obesity, depression, cervical cancer and thyroid  disease.  She presents today for hallucinations.  Patient reports over the past couple weeks she has been seeing shadows in her house.  She says she has thought there were people and called the police twice.  She says no one was ever found and nothing was taken from her home.  She also reports over the last several days she has began to see bugs crawling in her house.  Reports seeing white spots/worms on her living room furniture.  She called an exterminator a couple of times.  States they came out and told her the white spots were just chips in the wood and advised applying old [place].  She says she has seeing the white spots moving.  She also reports seeing bugs crawling on the walls.  States she feels like they are biting her and she has noticed some red spots on her legs and arms.  At this time, she does not have any visible rashes.  She did have an appointment with her primary care provider on Friday but neglected to mention the hallucinations.  She says she forgot and is more concerned about her medications.  She thinks she started a couple of new medicines recently, but is not sure what the meds are. Her family is aware of the hallucinations but patient brought herself to the urgent care alone today.  She says that she was driving crazy and almost hit a car on the way here.  She denies suicidal or homicidal ideation.  She  denies any drug use.  No previous history of hallucinations.  HPI  Past Medical History:  Diagnosis Date   Anginal pain (HCC)    Arthritis    osteoarthritis   Cancer (HCC)    cervical   Cardiomyopathy (HCC)    CHF (congestive heart failure) (HCC)    Colon adenomas     COVID-19    06/2019   Depression    Esophagitis    Fibromyalgia    GERD (gastroesophageal reflux disease)    History of kidney stones    Hypothyroidism    Non-toxic goiter    Obesity    Syncope    Thyroid  nodule     There are no active problems to display for this patient.   Past Surgical History:  Procedure Laterality Date   ABDOMINAL HYSTERECTOMY     BREAST BIOPSY Left 06/27/2021   left breast stereo calcs, ribbon marker, path pending   CARDIAC CATHETERIZATION     COLONOSCOPY WITH PROPOFOL  N/A 08/12/2015   Procedure: COLONOSCOPY WITH PROPOFOL ;  Surgeon: Lamar ONEIDA Holmes, MD;  Location: [facility] ENDOSCOPY;  Service: Endoscopy;  Laterality: N/A;   COLONOSCOPY WITH PROPOFOL  N/A 12/03/2019   Procedure: COLONOSCOPY WITH PROPOFOL ;  Surgeon: Toledo, Ladell POUR, MD;  Location: [facility] ENDOSCOPY;  Service: Gastroenterology;  Laterality: N/A;   COLONOSCOPY WITH PROPOFOL  N/A 04/05/2023   Procedure: COLONOSCOPY WITH PROPOFOL ;  Surgeon: Maryruth Ole ONEIDA, MD;  Location: [facility] ENDOSCOPY;  Service: Endoscopy;  Laterality: N/A;   ESOPHAGOGASTRODUODENOSCOPY (EGD) WITH PROPOFOL  N/A 08/12/2015   Procedure: ESOPHAGOGASTRODUODENOSCOPY (EGD) WITH PROPOFOL ;  Surgeon: Lamar ONEIDA Holmes, MD;  Location: [facility] ENDOSCOPY;  Service: Endoscopy;  Laterality: N/A;   POLYPECTOMY  04/05/2023   Procedure: POLYPECTOMY;  Surgeon: Maryruth Ole DASEN, MD;  [facility];  Service: Endoscopy;;    OB History   No obstetric history on file.      Home Medications    Prior to Admission medications   Medication Sig Start Date End Date Taking? Authorizing Provider  albuterol  (VENTOLIN  HFA) 108 (90 Base) MCG/ACT inhaler SMARTSIG:Via Inhaler    [provider]  amitriptyline (ELAVIL) 50 MG tablet Take 50 mg by mouth at bedtime.    [provider]  atorvastatin  (LIPITOR) 20 MG tablet Take 20 mg by mouth daily.    [provider]  buPROPion (WELLBUTRIN XL) 300 MG 24 hr tablet Take  300 mg by mouth daily.    [provider]  carvedilol  (COREG ) 6.25 MG tablet Take 6.25 mg by mouth 2 (two) times daily with a meal.    [provider]  cholecalciferol  (VITAMIN D3) 25 MCG (1000 UNIT) tablet Take 1,000 Units by mouth daily.    [provider]  DULoxetine (CYMBALTA) 60 MG capsule Take 60 mg by mouth 2 (two) times daily.     [provider]  famotidine  (PEPCID ) 20 MG tablet Take 20 mg by mouth 2 (two) times daily.    [provider]  gabapentin  (NEURONTIN ) 600 MG tablet Take 600 mg by mouth 3 (three) times daily.    [provider]  hydrochlorothiazide  (HYDRODIURIL ) 25 MG tablet Take 25 mg by mouth daily.    [provider]  ibuprofen  (ADVIL ) 200 MG tablet Take 200 mg by mouth every 6 (six) hours as needed.    [provider]  levothyroxine  (SYNTHROID , LEVOTHROID) 112 MCG tablet Take 112 mcg by mouth daily before breakfast.    [provider]  lisinopril  (PRINIVIL ,ZESTRIL ) 5 MG tablet Take 5 mg by mouth daily.    [provider]  meloxicam (MOBIC) 7.5 MG tablet Take 7.5 mg by mouth daily.    [provider]  nortriptyline (PAMELOR) 10 MG capsule Take 30 mg by mouth at bedtime.    [provider]  omeprazole (PRILOSEC) 20 MG capsule Take 20 mg by mouth daily.    [provider]  tizanidine (ZANAFLEX) 2 MG capsule Take 2 mg by mouth 3 (three) times daily.    [provider]    Family History Family History  Problem Relation Age of Onset   Breast cancer Neg Hx     Social History Social History   Tobacco Use   Smoking status: Never   Smokeless tobacco: Never  Vaping Use   Vaping status: Never Used  Substance Use Topics   Alcohol use: No   Drug use: No     Allergies   Penicillins   Review of Systems Review of Systems  Skin:  Positive for rash.  Psychiatric/Behavioral:  Positive for confusion and hallucinations. Negative for suicidal ideas.       Physical Exam Triage Vital Signs ED Triage Vitals [01/06/24 1207]  Encounter Vitals Group     BP      Girls Systolic BP Percentile      Girls Diastolic BP Percentile      Boys Systolic BP Percentile      Boys Diastolic BP Percentile      Pulse      Resp 16     Temp      Temp Source Oral     SpO2      Weight      Height  Head Circumference      Peak Flow      Pain Score      Pain Loc      Pain Education      Exclude from Growth Chart    No data found.  Updated Vital Signs BP (!) 140/121 (BP Location: Left Arm)   Pulse 68   Temp 99.1 F (37.3 C) (Oral)   Resp 16   Ht 5' 4 (1.626 m)   Wt 266 lb (120.7 kg)   SpO2 95%   BMI 45.66 kg/m      Physical Exam Vitals and nursing note reviewed.  Constitutional:      General: She is not in acute distress.    Appearance: Normal appearance. She is not ill-appearing or toxic-appearing.  HENT:     Head: Normocephalic and atraumatic.     Nose: Nose normal.     Mouth/Throat:     Mouth: Mucous membranes are moist.     Pharynx: Oropharynx is clear.  Eyes:     General: No scleral icterus.       Right eye: No discharge.        Left eye: No discharge.     Conjunctiva/sclera: Conjunctivae normal.  Cardiovascular:     Rate and Rhythm: Normal rate and regular rhythm.     Heart sounds: Normal heart sounds.  Pulmonary:     Effort: Pulmonary effort is normal. No respiratory distress.     Breath sounds: Normal breath sounds.  Musculoskeletal:     Cervical back: Neck supple.  Skin:    General: Skin is dry.     Findings: No rash.  Neurological:     General: No focal deficit present.     Mental Status: She is alert and oriented to person, place, and time. Mental status is at baseline.     Cranial Nerves: No cranial nerve deficit.     Motor: No weakness.     Coordination: Coordination normal.     Gait: Gait normal.  Psychiatric:        Mood and Affect: Mood is anxious.        Speech: Speech is rapid and pressured.         Behavior: Behavior is cooperative.        Thought Content: Thought content is paranoid and delusional.      UC Treatments / Results  Labs (all labs ordered are listed, but only abnormal results are displayed) Labs Reviewed  URINALYSIS, W/ REFLEX TO CULTURE (INFECTION SUSPECTED) - Abnormal; Notable for the following components:      Result Value   Specific Gravity, Urine <1.005 (*)    Hgb urine dipstick TRACE (*)    Bilirubin Urine SMALL (*)    Ketones, ur 15 (*)    Bacteria, UA FEW (*)    All other components within normal limits    EKG   Radiology No results found.  Procedures Procedures (including critical care time)  Medications Ordered in UC Medications - No data to display  Initial Impression / Assessment and Plan / UC Course  I have reviewed the triage vital signs and the nursing notes.  Pertinent labs & imaging results that were available during my care of the patient were reviewed by me and considered in my medical decision making (see chart for details).   63 year old female presents for concerns about visual hallucinations over the past couple weeks.  She reports seeing shadows and feeling like people have been in her home.  Also reports feeling like there are bugs crawling on her and in her home.  She has felt like she has had rashes.  Patient has called the authorities twice and exterminator twice.  Patient saw PCP 3 days ago but did not mention hallucinations.  She has never had hallucinations before.  Patient denies suicidal or homicidal ideations.  Psychiatric history significant for anxiety and depression.  Blood pressure elevated 140/121.  Other vitals normal and stable.  Overall well-appearing.  She is speaking quickly and showing me her phone with multiple videos and pictures relating to her concerns about possible bugs in the home.  I do not detect any abnormalities on the images that she has shown me.  She has been alert and oriented x 3.  Currently  not having any visual hallucinations.  Denies auditory hallucinations.  Appears anxious.    Urinalysis obtained to assess for possible UTI is negative.   Discussed results with patient. Advised further and immediate work up in the ER at this time. I am unsure if hallucinations are due to underlying neurological problem, psychiatric condition, toxin, or medication mismanagement or side effects. Patient is understanding and agrees to go to the ED. She requested I call EMS for her since she did not feel comfortable driving herself. EMS contacts. Report given by me. Patient leaving in stable condition.   Final Clinical Impressions(s) / UC Diagnoses   Final diagnoses:  Visual hallucinations  Altered mental status, unspecified altered mental status type     Discharge Instructions      You have been advised to follow up immediately in the emergency department for concerning signs.symptoms. If you declined EMS transport, please have a family member take you directly to the ED at this time. Do not delay. Based on concerns about condition, if you do not follow up in th e ED, you may risk poor outcomes including worsening of condition, delayed treatment and potentially life threatening issues. If you have declined to go to the ED at this time, you should call your PCP immediately to set up a follow up appointment.  Go to ED for red flag symptoms, including; fevers you cannot reduce with Tylenol/Motrin , severe headaches, vision changes, numbness/weakness in part of the body, lethargy, confusion, intractable vomiting, severe dehydration, chest pain, breathing difficulty, severe persistent abdominal or pelvic pain, signs of severe infection (increased redness, swelling of an area), feeling faint or passing out, dizziness, etc. You should especially go to the ED for sudden acute worsening of condition if you do not elect to go at this time.      ED Prescriptions   None    PDMP not reviewed this  encounter.   Arvis Jolan NOVAK, PA-C 01/06/24 2008

## 2024-01-06 NOTE — Discharge Instructions (Signed)

## 2024-01-07 LAB — BLOOD GAS, VENOUS
Acid-Base Excess: 1.3 mmol/L (ref 0.0–2.0)
Bicarbonate: 25 mmol/L (ref 20.0–28.0)
Patient temperature: 37
pCO2, Ven: 36 mmHg — ABNORMAL LOW (ref 44–60)
pH, Ven: 7.45 — ABNORMAL HIGH (ref 7.25–7.43)

## 2024-01-07 NOTE — ED Notes (Signed)
 Pt awake and sitting on side of bed

## 2024-01-07 NOTE — Progress Notes (Signed)
 Per TTS, patient is psych cleared for discharge and will need outpatient psychiatric appointment.  [facility] scheduled a follow-up outpatient psych appointment with [facility] for Friday, July 25th at 10AM. Patient is in agreement with the location and date/time of appointment.  Patient was provided a hard copy of all appointment details.  Patient is grieving the loss of her husband. Patient was also given resources for local grief support groups along with an online option.  [place], [facility] [phone]

## 2024-01-07 NOTE — Discharge Instructions (Signed)
 Follow-up with your thyroid  test with your primary care doctor return to the ER if you develop worsening symptoms or any other concerns

## 2024-01-07 NOTE — ED Provider Notes (Addendum)
 Pt was cleared by psych for discharge- see there note for more details.    Family is updated by psych and agree with plan.   Ernest Ronal BRAVO, MD 01/07/24 1334    Ernest Ronal BRAVO, MD 01/07/24 [phone]

## 2024-01-07 NOTE — ED Notes (Signed)
 Belongings bag 2/2 sent home with patient at this time.

## 2024-01-07 NOTE — ED Provider Notes (Signed)
 Emergency Medicine Observation Re-evaluation Note  Carla Mann is a 63 y.o. female, seen on rounds today.  Pt initially presented to the ED for complaints of Hallucinations (/)  Currently, the patient is is no acute distress. Denies any concerns at this time.  Physical Exam  Blood pressure (!) 150/88, pulse 64, temperature 98.4 F (36.9 C), temperature source Oral, resp. rate 18, height 5' 4 (1.626 m), weight 117.9 kg, SpO2 99%.  Physical Exam: General: No apparent distress Pulm: Normal WOB Neuro: Moving all extremities Psych: Resting comfortably     ED Course / MDM   Clinical Course as of 01/07/24 0622  Mon Jan 06, 2024  1921 Potassium(!): 3.0 Will need repleation [HD]  1921 Tricyclic, Ur Screen(!): POSITIVE [HD]  1921 Alcohol, Ethyl (B): <15 [HD]  2055 WBC(!): 12.2 Mild leukocytosis but patient afebrile [HD]  2055 T4,Free(Direct)(!): 1.43 [HD]  2056 pH, Ven(!): 7.45 Not acidotic [HD]  2057 TSH(!): 6.944 [HD]  2057 T4,Free(Direct)(!): 1.43 Patient has subclinical hypothyroidism.  Given the degree, I do not think this would be responsible for patient's auditory and visual hallucinations [HD]  2324 Urinalysis, Complete w Microscopic -Urine, Clean Catch(!) UA unremarkbale [HD]  2325 Medically cleared - history of depression, no with visual and auditory hallucinations. No history of psych hospitalizations.  Recent changes to home medications. Subclinical hypothyroidism - on thyroid  medications. MRI neg. Plan for psych eval.  [SM]    Clinical Course User Index [HD] Nicholaus Rolland BRAVO, MD [SM] Suzanne Kirsch, MD    I have reviewed the labs performed to date as well as medications administered while in observation.  Recent changes in the last 24 hours include: No acute events overnight.  Patient medically cleared.  Plan   Current plan: Patient awaiting psychiatric disposition. Patient is not under full IVC at this time.    Suzanne Kirsch, MD 01/07/24 [phone]

## 2024-01-07 NOTE — ED Notes (Signed)
 Blue shoes White striped shirt Pink leggings Blue hair tie 2 yellow colored rings White duffel bag Blue bag Black bag

## 2024-01-07 NOTE — ED Notes (Signed)
 Psych team at bedside .
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
## 2024-01-07 NOTE — ED Notes (Signed)
VOL  pending  consult 

## 2024-05-04 ENCOUNTER — Encounter: Payer: Self-pay | Admitting: Emergency Medicine

## 2024-05-04 ENCOUNTER — Ambulatory Visit
Admission: EM | Admit: 2024-05-04 | Discharge: 2024-05-04 | Disposition: A | Discharge status: Home / Self Care | Attending: Emergency Medicine | Admitting: Emergency Medicine

## 2024-05-04 DIAGNOSIS — S0990XA Unspecified injury of head, initial encounter: Secondary | ICD-10-CM | POA: Diagnosis not present

## 2024-05-04 DIAGNOSIS — H53143 Visual discomfort, bilateral: Secondary | ICD-10-CM
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
## 2024-05-04 NOTE — ED Triage Notes (Signed)
 Pt states she hit her head on her headboard 5 days ago. Pt denies loss of consciousness at that time. Yesterday when she woke up she had a bad headache and she couldn't open.She applied warm compress to help her open her eyes. She denies any drainage around her eyes.

## 2024-05-04 NOTE — Discharge Instructions (Signed)
 I believe her symptoms are coming from the lites bothering your eyes.  I would discontinue the Excedrin and start taking Tylenol 1000 mg 3-4 times a day as needed for pain.  Please follow-up with your primary care provider soon as you can.  If you are not better in 24 hours, if you get worse, go immediately to the emergency department to rule out a significant head injury.

## 2024-05-11 ENCOUNTER — Other Ambulatory Visit: Payer: Self-pay | Admitting: Family Medicine

## 2024-05-11 DIAGNOSIS — Z1231 Encounter for screening mammogram for malignant neoplasm of breast: Secondary | ICD-10-CM

## 2024-06-16 ENCOUNTER — Ambulatory Visit
Admission: RE | Admit: 2024-06-16 | Discharge: 2024-06-16 | Disposition: A | Discharge status: Home / Self Care | Source: Ambulatory Visit | Attending: Family Medicine | Admitting: Family Medicine

## 2024-06-16 DIAGNOSIS — Z1231 Encounter for screening mammogram for malignant neoplasm of breast: Secondary | ICD-10-CM | POA: Insufficient documentation
